# Patient Record
Sex: Male | Born: 1935 | Race: White | Hispanic: No | Smoking: Former smoker
Health system: Southern US, Community
[De-identification: ages and names within clinical notes are randomized; demographics above are authoritative.]

## PROBLEM LIST (undated history)

## (undated) DIAGNOSIS — IMO0001 Reserved for inherently not codable concepts without codable children: Secondary | ICD-10-CM

## (undated) DIAGNOSIS — E785 Hyperlipidemia, unspecified: Secondary | ICD-10-CM

## (undated) DIAGNOSIS — D649 Anemia, unspecified: Secondary | ICD-10-CM

## (undated) DIAGNOSIS — Z5189 Encounter for other specified aftercare: Secondary | ICD-10-CM

## (undated) DIAGNOSIS — F32A Depression, unspecified: Secondary | ICD-10-CM

## (undated) DIAGNOSIS — I10 Essential (primary) hypertension: Secondary | ICD-10-CM

## (undated) DIAGNOSIS — F329 Major depressive disorder, single episode, unspecified: Secondary | ICD-10-CM

## (undated) HISTORY — PX: TONSILLECTOMY AND ADENOIDECTOMY: SUR1326

---

## 1998-05-10 ENCOUNTER — Emergency Department (HOSPITAL_COMMUNITY): Admission: EM | Admit: 1998-05-10 | Discharge: 1998-05-10 | Payer: Self-pay | Admitting: Emergency Medicine

## 2001-11-08 ENCOUNTER — Encounter: Admission: RE | Admit: 2001-11-08 | Discharge: 2001-11-08 | Payer: Self-pay | Admitting: Psychiatry

## 2002-01-02 ENCOUNTER — Emergency Department (HOSPITAL_COMMUNITY): Admission: EM | Admit: 2002-01-02 | Discharge: 2002-01-02 | Payer: Self-pay

## 2004-06-30 ENCOUNTER — Ambulatory Visit: Payer: Self-pay | Admitting: Psychiatry

## 2004-06-30 ENCOUNTER — Ambulatory Visit (HOSPITAL_COMMUNITY): Payer: Self-pay | Admitting: Psychiatry

## 2004-07-22 ENCOUNTER — Ambulatory Visit (HOSPITAL_COMMUNITY): Admission: EM | Admit: 2004-07-22 | Discharge: 2004-07-22 | Payer: Self-pay | Admitting: Emergency Medicine

## 2005-09-29 ENCOUNTER — Emergency Department (HOSPITAL_COMMUNITY): Admission: EM | Admit: 2005-09-29 | Discharge: 2005-09-29 | Payer: Self-pay | Admitting: Emergency Medicine

## 2011-06-23 ENCOUNTER — Observation Stay (HOSPITAL_COMMUNITY)
Admission: EM | Admit: 2011-06-23 | Discharge: 2011-06-24 | Disposition: A | Discharge status: Home / Self Care | Payer: Medicare Other | Attending: Internal Medicine | Admitting: Internal Medicine

## 2011-06-23 ENCOUNTER — Encounter (HOSPITAL_COMMUNITY): Payer: Self-pay | Admitting: *Deleted

## 2011-06-23 DIAGNOSIS — F329 Major depressive disorder, single episode, unspecified: Secondary | ICD-10-CM | POA: Diagnosis present

## 2011-06-23 DIAGNOSIS — D72829 Elevated white blood cell count, unspecified: Secondary | ICD-10-CM | POA: Diagnosis present

## 2011-06-23 DIAGNOSIS — R55 Syncope and collapse: Principal | ICD-10-CM | POA: Insufficient documentation

## 2011-06-23 DIAGNOSIS — I10 Essential (primary) hypertension: Secondary | ICD-10-CM | POA: Insufficient documentation

## 2011-06-23 DIAGNOSIS — E785 Hyperlipidemia, unspecified: Secondary | ICD-10-CM | POA: Insufficient documentation

## 2011-06-23 DIAGNOSIS — N289 Disorder of kidney and ureter, unspecified: Secondary | ICD-10-CM | POA: Insufficient documentation

## 2011-06-23 DIAGNOSIS — IMO0002 Reserved for concepts with insufficient information to code with codable children: Secondary | ICD-10-CM

## 2011-06-23 DIAGNOSIS — W19XXXA Unspecified fall, initial encounter: Secondary | ICD-10-CM | POA: Insufficient documentation

## 2011-06-23 DIAGNOSIS — F3289 Other specified depressive episodes: Secondary | ICD-10-CM | POA: Insufficient documentation

## 2011-06-23 DIAGNOSIS — N179 Acute kidney failure, unspecified: Secondary | ICD-10-CM

## 2011-06-23 DIAGNOSIS — Y92009 Unspecified place in unspecified non-institutional (private) residence as the place of occurrence of the external cause: Secondary | ICD-10-CM | POA: Insufficient documentation

## 2011-06-23 DIAGNOSIS — S0180XA Unspecified open wound of other part of head, initial encounter: Secondary | ICD-10-CM | POA: Insufficient documentation

## 2011-06-23 HISTORY — DX: Anemia, unspecified: D64.9

## 2011-06-23 HISTORY — DX: Depression, unspecified: F32.A

## 2011-06-23 HISTORY — DX: Encounter for other specified aftercare: Z51.89

## 2011-06-23 HISTORY — DX: Essential (primary) hypertension: I10

## 2011-06-23 HISTORY — DX: Major depressive disorder, single episode, unspecified: F32.9

## 2011-06-23 HISTORY — DX: Hyperlipidemia, unspecified: E78.5

## 2011-06-23 HISTORY — DX: Reserved for inherently not codable concepts without codable children: IMO0001

## 2011-06-23 NOTE — ED Notes (Signed)
Per EMS - pt from home, pt lives alone - pt was sitting at computer tonight when experienced a syncopal episode, pt awoke went to the bathroom noted a laceration to left scalp and experienced a second syncopal episode - pt went to neighbor and called 911. Pt w/ approx 3in lac to left scalp and small lac to chin. Dressings applied by EMS. On arrival pt A&Ox4 in no acute distress.

## 2011-06-24 ENCOUNTER — Encounter (HOSPITAL_COMMUNITY): Payer: Self-pay | Admitting: Radiology

## 2011-06-24 ENCOUNTER — Emergency Department (HOSPITAL_COMMUNITY): Payer: Medicare Other

## 2011-06-24 ENCOUNTER — Observation Stay (HOSPITAL_COMMUNITY): Payer: Medicare Other

## 2011-06-24 DIAGNOSIS — D72829 Elevated white blood cell count, unspecified: Secondary | ICD-10-CM | POA: Diagnosis present

## 2011-06-24 DIAGNOSIS — E785 Hyperlipidemia, unspecified: Secondary | ICD-10-CM | POA: Diagnosis present

## 2011-06-24 DIAGNOSIS — I10 Essential (primary) hypertension: Secondary | ICD-10-CM | POA: Diagnosis present

## 2011-06-24 DIAGNOSIS — R55 Syncope and collapse: Secondary | ICD-10-CM | POA: Diagnosis present

## 2011-06-24 DIAGNOSIS — F329 Major depressive disorder, single episode, unspecified: Secondary | ICD-10-CM | POA: Diagnosis present

## 2011-06-24 DIAGNOSIS — N179 Acute kidney failure, unspecified: Secondary | ICD-10-CM | POA: Diagnosis present

## 2011-06-24 DIAGNOSIS — I517 Cardiomegaly: Secondary | ICD-10-CM

## 2011-06-24 LAB — CBC
Hemoglobin: 13.8 g/dL (ref 13.0–17.0)
MCH: 30.3 pg (ref 26.0–34.0)
MCHC: 33.4 g/dL (ref 30.0–36.0)
MCHC: 34.1 g/dL (ref 30.0–36.0)
MCV: 89.2 fL (ref 78.0–100.0)
Platelets: 275 10*3/uL (ref 150–400)
RBC: 4.63 MIL/uL (ref 4.22–5.81)

## 2011-06-24 LAB — DIFFERENTIAL
Eosinophils Absolute: 0 10*3/uL (ref 0.0–0.7)
Lymphs Abs: 1.3 10*3/uL (ref 0.7–4.0)
Monocytes Relative: 5 % (ref 3–12)
Neutrophils Relative %: 88 % — ABNORMAL HIGH (ref 43–77)

## 2011-06-24 LAB — POCT I-STAT, CHEM 8
Calcium, Ion: 1.2 mmol/L (ref 1.12–1.32)
Creatinine, Ser: 1.8 mg/dL — ABNORMAL HIGH (ref 0.50–1.35)
Glucose, Bld: 110 mg/dL — ABNORMAL HIGH (ref 70–99)
HCT: 43 % (ref 39.0–52.0)
Hemoglobin: 14.6 g/dL (ref 13.0–17.0)
Potassium: 3.8 mEq/L (ref 3.5–5.1)
Sodium: 142 mEq/L (ref 135–145)

## 2011-06-24 LAB — URINALYSIS, ROUTINE W REFLEX MICROSCOPIC
Bilirubin Urine: NEGATIVE
Glucose, UA: NEGATIVE mg/dL
Leukocytes, UA: NEGATIVE
Nitrite: NEGATIVE
Protein, ur: NEGATIVE mg/dL
Urobilinogen, UA: 1 mg/dL (ref 0.0–1.0)
pH: 5.5 (ref 5.0–8.0)

## 2011-06-24 LAB — CARDIAC PANEL(CRET KIN+CKTOT+MB+TROPI)
Relative Index: 1.5 (ref 0.0–2.5)
Total CK: 188 U/L (ref 7–232)

## 2011-06-24 LAB — D-DIMER, QUANTITATIVE: D-Dimer, Quant: 0.63 ug/mL-FEU — ABNORMAL HIGH (ref 0.00–0.48)

## 2011-06-24 MED ORDER — HYDROCODONE-ACETAMINOPHEN 5-325 MG PO TABS: 1.0000 | ORAL_TABLET | ORAL | Status: DC | PRN

## 2011-06-24 MED ORDER — POTASSIUM CHLORIDE IN NACL 20-0.9 MEQ/L-% IV SOLN
INTRAVENOUS | Status: DC
  Administered 2011-06-24: 07:00:00 via INTRAVENOUS
  Filled 2011-06-24 (×2): qty 1000

## 2011-06-24 MED ORDER — LISINOPRIL 5 MG PO TABS
5.0000 mg | ORAL_TABLET | Freq: Every day | ORAL | Status: DC
  Filled 2011-06-24: qty 1

## 2011-06-24 MED ORDER — TETANUS-DIPHTH-ACELL PERTUSSIS 5-2.5-18.5 LF-MCG/0.5 IM SUSP
0.5000 mL | Freq: Once | INTRAMUSCULAR | Status: AC
  Administered 2011-06-24: 0.5 mL via INTRAMUSCULAR
  Filled 2011-06-24: qty 0.5

## 2011-06-24 MED ORDER — SODIUM CHLORIDE 0.9 % IV SOLN
INTRAVENOUS | Status: DC
  Administered 2011-06-24: 11:00:00 via INTRAVENOUS

## 2011-06-24 MED ORDER — PNEUMOCOCCAL VAC POLYVALENT 25 MCG/0.5ML IJ INJ: 0.5000 mL | INJECTION | INTRAMUSCULAR | Status: DC

## 2011-06-24 MED ORDER — ACETAMINOPHEN 325 MG PO TABS: 650.0000 mg | ORAL_TABLET | Freq: Four times a day (QID) | ORAL | Status: DC | PRN

## 2011-06-24 MED ORDER — SIMVASTATIN 20 MG PO TABS
20.0000 mg | ORAL_TABLET | Freq: Every day | ORAL | Status: DC
  Filled 2011-06-24: qty 1

## 2011-06-24 MED ORDER — ACETAMINOPHEN 650 MG RE SUPP: 650.0000 mg | Freq: Four times a day (QID) | RECTAL | Status: DC | PRN

## 2011-06-24 MED ORDER — VENLAFAXINE HCL ER 150 MG PO CP24
150.0000 mg | ORAL_CAPSULE | Freq: Every day | ORAL | Status: DC
  Administered 2011-06-24: 150 mg via ORAL
  Filled 2011-06-24: qty 1

## 2011-06-24 MED ORDER — ASPIRIN EC 81 MG PO TBEC
81.0000 mg | DELAYED_RELEASE_TABLET | Freq: Every day | ORAL | Status: DC
  Administered 2011-06-24: 81 mg via ORAL
  Filled 2011-06-24: qty 1

## 2011-06-24 NOTE — ED Notes (Signed)
Report called to [facility], awaiting pt chest xray before sending up to unit. [place], [place] M

## 2011-06-24 NOTE — H&P (Signed)
PCP:   Paulino Rily, MD, MD   Chief Complaint:  Loss of consciousness  HPI: This is a 76 year old gentleman who lives alone. He states he wants to go to bathroom to have a BM. He went that he remembers being unsuccessful at his attempted #2. The next thing he remembers is waking up in with his head feeling damp. The patient had syncopized and collapsed and sustained left forehead laceration and two submandibular laceration. He does not remember any of the event. Currently he does not even know if he was found down in the bathroom or on the way back to the computer room. He went his neighbors house, where 911 was called. He states he was not ill prior to the episode, he does not remember having nausea, vomiting, diarrhea, no chest pains, no fevers, cough, BOU, or chills in the hours or days preceding the episode but he does not remember what proceeded or occurred during the episode. He remained to be patient is alert and oriented and provided his history.  Review of Systems: Positives bolded anorexia, fever, weight loss,, vision loss, decreased hearing, hoarseness, chest pain, syncope, dyspnea on exertion, peripheral edema, balance deficits, hemoptysis, abdominal pain, melena, hematochezia, severe indigestion/heartburn, hematuria, incontinence, genital sores, muscle weakness, suspicious skin lesions, transient blindness, difficulty walking, depression, unusual weight change, abnormal bleeding, enlarged lymph nodes, angioedema, and breast masses.  Past Medical History: Past Medical History  Diagnosis Date  . Hypertension   . Hyperlipidemia    Past Surgical History  Procedure Date  . Tonsillectomy and adenoidectomy     Medications: Prior to Admission medications   Medication Sig Start Date End Date Taking? Authorizing Provider  aspirin EC 81 MG tablet Take 81 mg by mouth daily.   Yes Historical Provider, MD  CALCIUM PO Take 1 tablet by mouth daily.   Yes Historical Provider, MD  LISINOPRIL  PO Take 1 tablet by mouth daily.   Yes Historical Provider, MD  Multiple Vitamins-Minerals (MULTIVITAMINS THER. W/MINERALS) TABS Take 1 tablet by mouth daily.   Yes Historical Provider, MD  PRESCRIPTION MEDICATION Take 1 tablet by mouth daily.   Yes Historical Provider, MD  venlafaxine XR (EFFEXOR-XR) 150 MG 24 hr capsule Take 150 mg by mouth daily.   Yes Historical Provider, MD    Allergies:  No Known Allergies  Social History:  reports that he has been smoking Pipe.  He does not have any smokeless tobacco history on file. He reports that he does not drink alcohol or use illicit drugs.  Family History: History reviewed. No pertinent family history.  Physical Exam: Filed Vitals:   06/23/11 2353 06/24/11 0250 06/24/11 0449  BP: 110/58 105/68 102/58  Pulse: 90    Temp: 98.1 F (36.7 C) 98.6 F (37 C)   TempSrc: Oral Oral   Resp: 14 23 16   SpO2: 96% 95% 94%    General:  Alert and oriented times three, well developed and nourished, no acute distress Eyes: PERRLA, pink conjunctiva, no scleral icterus ENT: Moist oral mucosa, neck supple, no thyromegaly Lungs: clear to ascultation, no wheeze, no crackles, no use of accessory muscles Cardiovascular: regular rate and rhythm, no regurgitation, no gallops, no murmurs. No carotid bruits, no JVD Abdomen: soft, positive BS, non-tender, non-distended, no organomegaly, not an acute abdomen GU: not examined Neuro: CN II - XII grossly intact, sensation intact Musculoskeletal: strength 5/5 all extremities, no clubbing, cyanosis or edema, sutured laceration left forehead, submandibular sutures  Skin: no rash, no subcutaneous crepitation, no decubitus  Psych: appropriate patient   Labs on Admission:   Basename 06/24/11 0225  NA 142  K 3.8  CL 107  CO2 --  GLUCOSE 110*  BUN 31*  CREATININE 1.80*  CALCIUM --  MG --  PHOS --   No results found for this basename: AST:2,ALT:2,ALKPHOS:2,BILITOT:2,PROT:2,ALBUMIN:2 in the last 72 hours No  results found for this basename: LIPASE:2,AMYLASE:2 in the last 72 hours  Basename 06/24/11 0225 06/24/11 0121  WBC -- 19.8*  NEUTROABS -- 17.4*  HGB 14.6 13.8  HCT 43.0 41.3  MCV -- 89.2  PLT -- 291   No results found for this basename: CKTOTAL:3,CKMB:3,CKMBINDEX:3,TROPONINI:3 in the last 72 hours No components found with this basename: POCBNP:3 No results found for this basename: DDIMER:2 in the last 72 hours No results found for this basename: HGBA1C:2 in the last 72 hours No results found for this basename: CHOL:2,HDL:2,LDLCALC:2,TRIG:2,CHOLHDL:2,LDLDIRECT:2 in the last 72 hours No results found for this basename: TSH,T4TOTAL,FREET3,T3FREE,THYROIDAB in the last 72 hours No results found for this basename: VITAMINB12:2,FOLATE:2,FERRITIN:2,TIBC:2,IRON:2,RETICCTPCT:2 in the last 72 hours  Micro Results: No results found for this or any previous visit (from the past 240 hour(s)). Results for Fred Thomas, Fred Thomas (MRN [id number]) as of 06/24/2011 05:41  Ref. Range 06/24/2011 04:30  Color, Urine Latest Range: YELLOW  YELLOW  APPearance Latest Range: CLEAR  CLEAR  Specific Gravity, Urine Latest Range: 1.005-1.030  1.018  pH Latest Range: 5.0-8.0  5.5  Glucose, UA Latest Range: NEGATIVE mg/dL NEGATIVE  Bilirubin Urine Latest Range: NEGATIVE  NEGATIVE  Ketones, ur Latest Range: NEGATIVE mg/dL NEGATIVE  Protein Latest Range: NEGATIVE mg/dL NEGATIVE  Urobilinogen, UA Latest Range: 0.0-1.0 mg/dL 1.0  Nitrite Latest Range: NEGATIVE  NEGATIVE  Leukocytes, UA Latest Range: NEGATIVE  NEGATIVE  Hgb urine dipstick Latest Range: NEGATIVE  NEGATIVE    Radiological Exams on Admission: Ct Head Wo Contrast  06/24/2011  *RADIOLOGY REPORT*  Clinical Data: Syncope, fall, head laceration.  CT HEAD WITHOUT CONTRAST,CT CERVICAL SPINE WITHOUT CONTRAST  Technique:  Contiguous axial images were obtained from the base of the skull through the vertex without contrast.,Technique: Multidetector CT imaging of the  cervical spine was performed. Multiplanar CT image reconstructions were also generated.  Comparison: None.  Findings: There is no evidence for acute hemorrhage, hydrocephalus, mass lesion, or abnormal extra-axial fluid collection.  No definite CT evidence for acute infarction.  The visualized paranasal sinuses and mastoid air cells are predominately clear. Left paramidline posterior scalp swelling.  No underlying calvarial fracture.  Cervical spine: Lung apices are clear.  Severe multilevel facet arthropathy.  Multilevel degenerative disc disease and anterior osteophyte formation.  Maintained craniocervical relationship.  No dens fracture.  There is minimal anterior wedging of the C5 vertebral body.  No displaced fracture.  Paravertebral soft tissues within normal limits.  IMPRESSION: No acute intracranial abnormality.  Posterior scalp swelling without underlying calvarial fracture.  Advanced multilevel degenerative changes of the cervical spine. There is minimal anterior wedging of C5 which is not favored to be acute however correlate with point tenderness.  No displaced fracture.  No dislocation.  Original Report Authenticated By: Waneta Martins, M.D.   Ct Cervical Spine Wo Contrast  06/24/2011  *RADIOLOGY REPORT*  Clinical Data: Syncope, fall, head laceration.  CT HEAD WITHOUT CONTRAST,CT CERVICAL SPINE WITHOUT CONTRAST  Technique:  Contiguous axial images were obtained from the base of the skull through the vertex without contrast.,Technique: Multidetector CT imaging of the cervical spine was performed. Multiplanar CT image reconstructions were also generated.  Comparison:  None.  Findings: There is no evidence for acute hemorrhage, hydrocephalus, mass lesion, or abnormal extra-axial fluid collection.  No definite CT evidence for acute infarction.  The visualized paranasal sinuses and mastoid air cells are predominately clear. Left paramidline posterior scalp swelling.  No underlying calvarial fracture.   Cervical spine: Lung apices are clear.  Severe multilevel facet arthropathy.  Multilevel degenerative disc disease and anterior osteophyte formation.  Maintained craniocervical relationship.  No dens fracture.  There is minimal anterior wedging of the C5 vertebral body.  No displaced fracture.  Paravertebral soft tissues within normal limits.  IMPRESSION: No acute intracranial abnormality.  Posterior scalp swelling without underlying calvarial fracture.  Advanced multilevel degenerative changes of the cervical spine. There is minimal anterior wedging of C5 which is not favored to be acute however correlate with point tenderness.  No displaced fracture.  No dislocation.  Original Report Authenticated By: Waneta Martins, M.D.     EKG: Normal sinus rhythm no ST segment changes  Assessment/Plan Present on Admission:  .Syncope and collapse Amnesia Admit to telemetry observation status With do a more complete workup including echo and carotid ultrasound. Will also cycle cardiac enzymes. Continue baby aspirin May be due to vasovagal episode that can accompany bowel movements  .Acute kidney injury .Leukocytosis Unexplained, chest x-ray is been ordered. Patient's UA is negative   will give IV fluids and repeat a BMP and CBC at 4 PM. Patient  asking if he can go home a workup is negative labs are improving, will defer to a.m. physician  No clear evidence of infection antibiotic not started Laceration to the forehead and under chin Sutured by ER physician  Full code SCDs for DVT prophylaxis T9/Dr. Delene Loll, Sosie Gato 06/24/2011, 5:41 AM

## 2011-06-24 NOTE — Progress Notes (Signed)
*  PRELIMINARY RESULTS* Echocardiogram 2D Echocardiogram has been performed.  Glean Salen [facility] 06/24/2011, 12:48 PM

## 2011-06-24 NOTE — Discharge Summary (Signed)
Physician Discharge Summary  Fred Thomas ZOX:[id number] DOB: May 14, 1935 DOA: 06/23/2011  PCP: Paulino Rily, MD, MD  Admit date: 06/23/2011 Discharge date: 06/24/2011  Recommendations for Outpatient Follow-up:  1. BP check - patient is now off medications 2. Resolution of neck pain - if persistent patient will need f/u with orthopedics or neurosurgery  3. Repeat CBC and BMET to assure normalization of values  4. Left ICA stenosis - not in symptomatic range - needs 1 year follow up   Discharge Diagnoses:  Active Problems:  Syncope and collapse  Acute kidney injury  Leukocytosis  Hypertension  Hyperlipidemia  Depression   Discharge Condition: good  Diet recommendation: regular  History of present illness:  This is a 76 year old gentleman who lives alone. He states he wants to go to bathroom to have a BM. He went that he remembers being unsuccessful at his attempted #2. The next thing he remembers is waking up in with his head feeling damp. The patient had syncopized and collapsed and sustained left forehead laceration and two submandibular laceration. He does not remember any of the event. Currently he does not even know if he was found down in the bathroom or on the way back to the computer room. He went his neighbors house, where 911 was called. He states he was not ill prior to the episode, he does not remember having nausea, vomiting, diarrhea, no chest pains, no fevers, cough, BOU, or chills in the hours or days preceding the episode but he does not remember what proceeded or occurred during the episode. He remained to be patient is alert and oriented and provided his history.   Hospital Course:  76 yo man admitted after syncopal event. He was kept on telemetry monitoring for 18 hours without any arrythmia noted. His echocardiogram, carotid dopplers and EEG were WNL. We observed that the patient had slightly elevated BUN and creatinine and WBCs suggesting mild dehydration  probably from diuretics. After iv fluids patient was completely asymptomatic and requested to be discharged home. We recommended discontinuation of the diuretic and acei and close F/U for lab work in the office.  The only other issue was patient's mild neck pain after fall. CT scan indicated possible minimal anterior wedging of C5 which is not favored to be acute. Patient was placed in a neck collar for comfort and he will f/u in the clinic.   Procedures: Echocardiogram Left ventricle: The cavity size was normal. Wall thickness was increased in a pattern of mild LVH. Systolic function was vigorous. The estimated ejection fraction was in the range of 65% to 70%.    Carotid doppler  No significant extracranial carotid artery stenosis demonstrated. Vertebrals are patent with antegrade flow. - Left proximal internal carotid is the in low 40-59% range velocities, but plaque formation does not support stenosis. This increase may be due proximal tortuosity. Other specific details can be found in the table(s) above   Discharge Exam: Filed Vitals:   06/24/11 1340  BP: 110/60  Pulse: 96  Temp: 98.1 F (36.7 C)  Resp: 20   Filed Vitals:   06/24/11 1101 06/24/11 1102 06/24/11 1103 06/24/11 1340  BP: 99/69 113/75 117/76 110/60  Pulse: 103 109 106 96  Temp:    98.1 F (36.7 C)  TempSrc:      Resp: 20 20 20 20   Height:      Weight:      SpO2: 96% 95% 95% 95%   General: alert and oriented x3 Cardiovascular: RRR, no  murmurs rubs or gallops Respiratory: CTAB Neuro - intact   Discharge Instructions  Discharge Orders    Future Orders Please Complete By Expires   Diet - low sodium heart healthy      Increase activity slowly        Medication List  As of 06/24/2011  5:14 PM   STOP taking these medications         CALCIUM PO      lisinopril-hydrochlorothiazide 20-12.5 MG per tablet      PRESCRIPTION MEDICATION         TAKE these medications         aspirin EC 81 MG tablet    Take 81 mg by mouth daily.      multivitamins ther. w/minerals Tabs   Take 1 tablet by mouth daily.      pravastatin 20 MG tablet   Commonly known as: PRAVACHOL   Take 20 mg by mouth daily.      venlafaxine XR 150 MG 24 hr capsule   Commonly known as: EFFEXOR-XR   Take 150 mg by mouth daily.           Follow-up Information    Follow up with Paulino Rily, MD in 5 days.   Contact information:   [address] [facility] [place] [address] [phone]           The results of significant diagnostics from this hospitalization (including imaging, microbiology, ancillary and laboratory) are listed below for reference.    Significant Diagnostic Studies: Dg Chest 1 View  06/24/2011  *RADIOLOGY REPORT*  Clinical Data: Chest pain, shortness of breath, fall.  CHEST - 1 VIEW  Comparison: 07/22/2004  Findings: Hyperaeration with increased interstitial markings.  No focal consolidation.  Elevated right hemidiaphragm is similar to prior.  No pleural effusion or pneumothorax.  No acute osseous abnormality identified.  IMPRESSION: COPD changes and without radiographic evidence for acute cardiopulmonary process.  Original Report Authenticated By: Waneta Martins, M.D.   Ct Head Wo Contrast  06/24/2011  *RADIOLOGY REPORT*  Clinical Data: Syncope, fall, head laceration.  CT HEAD WITHOUT CONTRAST,CT CERVICAL SPINE WITHOUT CONTRAST  Technique:  Contiguous axial images were obtained from the base of the skull through the vertex without contrast.,Technique: Multidetector CT imaging of the cervical spine was performed. Multiplanar CT image reconstructions were also generated.  Comparison: None.  Findings: There is no evidence for acute hemorrhage, hydrocephalus, mass lesion, or abnormal extra-axial fluid collection.  No definite CT evidence for acute infarction.  The visualized paranasal sinuses and mastoid air cells are predominately clear. Left paramidline posterior scalp swelling.  No  underlying calvarial fracture.  Cervical spine: Lung apices are clear.  Severe multilevel facet arthropathy.  Multilevel degenerative disc disease and anterior osteophyte formation.  Maintained craniocervical relationship.  No dens fracture.  There is minimal anterior wedging of the C5 vertebral body.  No displaced fracture.  Paravertebral soft tissues within normal limits.  IMPRESSION: No acute intracranial abnormality.  Posterior scalp swelling without underlying calvarial fracture.  Advanced multilevel degenerative changes of the cervical spine. There is minimal anterior wedging of C5 which is not favored to be acute however correlate with point tenderness.  No displaced fracture.  No dislocation.  Original Report Authenticated By: Waneta Martins, M.D.   Ct Cervical Spine Wo Contrast  06/24/2011  *RADIOLOGY REPORT*  Clinical Data: Syncope, fall, head laceration.  CT HEAD WITHOUT CONTRAST,CT CERVICAL SPINE WITHOUT CONTRAST  Technique:  Contiguous axial images were obtained from the  base of the skull through the vertex without contrast.,Technique: Multidetector CT imaging of the cervical spine was performed. Multiplanar CT image reconstructions were also generated.  Comparison: None.  Findings: There is no evidence for acute hemorrhage, hydrocephalus, mass lesion, or abnormal extra-axial fluid collection.  No definite CT evidence for acute infarction.  The visualized paranasal sinuses and mastoid air cells are predominately clear. Left paramidline posterior scalp swelling.  No underlying calvarial fracture.  Cervical spine: Lung apices are clear.  Severe multilevel facet arthropathy.  Multilevel degenerative disc disease and anterior osteophyte formation.  Maintained craniocervical relationship.  No dens fracture.  There is minimal anterior wedging of the C5 vertebral body.  No displaced fracture.  Paravertebral soft tissues within normal limits.  IMPRESSION: No acute intracranial abnormality.  Posterior  scalp swelling without underlying calvarial fracture.  Advanced multilevel degenerative changes of the cervical spine. There is minimal anterior wedging of C5 which is not favored to be acute however correlate with point tenderness.  No displaced fracture.  No dislocation.  Original Report Authenticated By: Waneta Martins, M.D.    Microbiology: No results found for this or any previous visit (from the past 240 hour(s)).   Labs: Basic Metabolic Panel:  Lab 06/24/11 1610  NA 142  K 3.8  CL 107  CO2 --  GLUCOSE 110*  BUN 31*  CREATININE 1.80*  CALCIUM --  MG --  PHOS --   Liver Function Tests: No results found for this basename: AST:5,ALT:5,ALKPHOS:5,BILITOT:5,PROT:5,ALBUMIN:5 in the last 168 hours No results found for this basename: LIPASE:5,AMYLASE:5 in the last 168 hours No results found for this basename: AMMONIA:5 in the last 168 hours CBC:  Lab 06/24/11 0917 06/24/11 0225 06/24/11 0121  WBC 13.8* -- 19.8*  NEUTROABS -- -- 17.4*  HGB 12.8* 14.6 13.8  HCT 37.5* 43.0 41.3  MCV 88.7 -- 89.2  PLT 275 -- 291   Cardiac Enzymes:  Lab 06/24/11 1550 06/24/11 0917 06/24/11 0640  CKTOTAL 186 188 --  CKMB 2.8 3.7 --  CKMBINDEX -- -- --  TROPONINI <0.30 <0.30 <0.30   BNP: BNP (last 3 results) No results found for this basename: PROBNP:3 in the last 8760 hours CBG: No results found for this basename: GLUCAP:5 in the last 168 hours  Time coordinating discharge: 1 hour  Signed:  Lonia Blood, MD  [facility] 06/24/2011, 5:14 PM

## 2011-06-24 NOTE — Progress Notes (Signed)
*  PRELIMINARY RESULTS* Vascular Ultrasound Carotid Duplex (Doppler) has been completed.  Preliminary findings: Bilaterally no significant ICA stenosis with antegrade vertebral flow.  Farrel Demark RDMS 06/24/2011, 12:56 PM

## 2011-06-24 NOTE — Progress Notes (Signed)
DC IV, DC Tele, DC Home. Discharge instructions and home medications discussed with patient. Patient denies any questions or concerns at that time. Patient leaving unit ambulatory and appears to be in no acute distress.

## 2011-06-24 NOTE — ED Provider Notes (Signed)
History     CSN: [id number]  Arrival date & time 06/23/11  2342   First MD Initiated Contact with Patient 06/24/11 0201      Chief Complaint  Patient presents with  . Loss of Consciousness  . Head Laceration    (Consider location/radiation/quality/duration/timing/severity/associated sxs/prior treatment) Patient is a 76 y.o. male presenting with syncope and scalp laceration. The history is provided by the patient.  Loss of Consciousness Pertinent negatives include no chest pain, no abdominal pain and no headaches.  Head Laceration Pertinent negatives include no chest pain, no abdominal pain and no headaches.   the patient is a 76 year old, male, who lives alone.  Is no history of coronary artery disease.  He was brought to the emergency department after having a syncopal episode and cutting his forehead, and chin on the left side.  He has no recollection of what occurred.  He has a mild headache now.  He denies nausea, or vision changes.  He denies neck pain.  He denies weakness, or paresthesias.  He does not smoke or drink alcohol or take drugs.  He denies recent illness.  He had no symptoms prior to fainting.  Past Medical History  Diagnosis Date  . Hypertension   . Hyperlipidemia     History reviewed. No pertinent past surgical history.  History reviewed. No pertinent family history.  History  Substance Use Topics  . Smoking status: Not on file  . Smokeless tobacco: Not on file  . Alcohol Use:       Review of Systems  Constitutional: Negative for fever and chills.  HENT: Negative for neck pain and neck stiffness.   Eyes: Negative for photophobia and visual disturbance.  Respiratory: Negative for chest tightness.   Cardiovascular: Positive for syncope. Negative for chest pain and palpitations.  Gastrointestinal: Negative for nausea, vomiting and abdominal pain.  Musculoskeletal: Negative for back pain.  Skin: Positive for wound.  Neurological: Positive for syncope.  Negative for headaches.  Hematological: Does not bruise/bleed easily.  Psychiatric/Behavioral: Negative for confusion.  All other systems reviewed and are negative.    Allergies  Review of patient's allergies indicates no known allergies.  Home Medications   Current Outpatient Rx  Name Route Sig Dispense Refill  . ASPIRIN EC 81 MG PO TBEC Oral Take 81 mg by mouth daily.    Marland Kitchen CALCIUM PO Oral Take 1 tablet by mouth daily.    Marland Kitchen LISINOPRIL PO Oral Take 1 tablet by mouth daily.    Carma Leaven M PLUS PO TABS Oral Take 1 tablet by mouth daily.    Marland Kitchen PRESCRIPTION MEDICATION Oral Take 1 tablet by mouth daily.    . VENLAFAXINE HCL ER 150 MG PO CP24 Oral Take 150 mg by mouth daily.      BP 110/58  Pulse 90  Temp(Src) 98.1 F (36.7 C) (Oral)  Resp 14  SpO2 96%  Physical Exam  Nursing note and vitals reviewed. Constitutional: He appears well-developed and well-nourished. No distress.  HENT:  Head: Normocephalic.       Left forehead, 6 cm clean laceration, with no active bleeding Left chin 2 cm clean laceration, with mild bleeding  Eyes: Conjunctivae are normal. Pupils are equal, round, and reactive to light.  Neck: Normal range of motion. Neck supple.       No cervical tenderness.  Collar removed after CAT scan results available  Cardiovascular: Normal rate.   No murmur heard. Pulmonary/Chest: Effort normal and breath sounds normal. No respiratory distress. He  has no rales.  Abdominal: Soft. He exhibits no distension.  Musculoskeletal: Normal range of motion. He exhibits no edema and no tenderness.  Neurological: He is alert. No cranial nerve deficit.  Skin: Skin is warm and dry.  Psychiatric: He has a normal mood and affect. Thought content normal.    ED Course  Procedures (including critical care time) Syncopal episode with left forehead, and left chin lacerations. The patient had no warning symptoms.  Prior to fainting.  He has no recollection of his fall.  There is no evidence  of neurological deficit.  His CAT scans do not show significant injury.  We'll perform laboratory testing, EKG, and arrange admission.  After suturing his wounds.  Labs Reviewed  CBC - Abnormal; Notable for the following:    WBC 19.8 (*)    All other components within normal limits  DIFFERENTIAL - Abnormal; Notable for the following:    Neutrophils Relative 88 (*)    Neutro Abs 17.4 (*)    Lymphocytes Relative 7 (*)    All other components within normal limits  POCT I-STAT, CHEM 8 - Abnormal; Notable for the following:    BUN 31 (*)    Creatinine, Ser 1.80 (*)    Glucose, Bld 110 (*)    All other components within normal limits  URINALYSIS, ROUTINE W REFLEX MICROSCOPIC   Ct Head Wo Contrast  06/24/2011  *RADIOLOGY REPORT*  Clinical Data: Syncope, fall, head laceration.  CT HEAD WITHOUT CONTRAST,CT CERVICAL SPINE WITHOUT CONTRAST  Technique:  Contiguous axial images were obtained from the base of the skull through the vertex without contrast.,Technique: Multidetector CT imaging of the cervical spine was performed. Multiplanar CT image reconstructions were also generated.  Comparison: None.  Findings: There is no evidence for acute hemorrhage, hydrocephalus, mass lesion, or abnormal extra-axial fluid collection.  No definite CT evidence for acute infarction.  The visualized paranasal sinuses and mastoid air cells are predominately clear. Left paramidline posterior scalp swelling.  No underlying calvarial fracture.  Cervical spine: Lung apices are clear.  Severe multilevel facet arthropathy.  Multilevel degenerative disc disease and anterior osteophyte formation.  Maintained craniocervical relationship.  No dens fracture.  There is minimal anterior wedging of the C5 vertebral body.  No displaced fracture.  Paravertebral soft tissues within normal limits.  IMPRESSION: No acute intracranial abnormality.  Posterior scalp swelling without underlying calvarial fracture.  Advanced multilevel degenerative  changes of the cervical spine. There is minimal anterior wedging of C5 which is not favored to be acute however correlate with point tenderness.  No displaced fracture.  No dislocation.  Original Report Authenticated By: Waneta Martins, M.D.   Ct Cervical Spine Wo Contrast  06/24/2011  *RADIOLOGY REPORT*  Clinical Data: Syncope, fall, head laceration.  CT HEAD WITHOUT CONTRAST,CT CERVICAL SPINE WITHOUT CONTRAST  Technique:  Contiguous axial images were obtained from the base of the skull through the vertex without contrast.,Technique: Multidetector CT imaging of the cervical spine was performed. Multiplanar CT image reconstructions were also generated.  Comparison: None.  Findings: There is no evidence for acute hemorrhage, hydrocephalus, mass lesion, or abnormal extra-axial fluid collection.  No definite CT evidence for acute infarction.  The visualized paranasal sinuses and mastoid air cells are predominately clear. Left paramidline posterior scalp swelling.  No underlying calvarial fracture.  Cervical spine: Lung apices are clear.  Severe multilevel facet arthropathy.  Multilevel degenerative disc disease and anterior osteophyte formation.  Maintained craniocervical relationship.  No dens fracture.  There is minimal anterior  wedging of the C5 vertebral body.  No displaced fracture.  Paravertebral soft tissues within normal limits.  IMPRESSION: No acute intracranial abnormality.  Posterior scalp swelling without underlying calvarial fracture.  Advanced multilevel degenerative changes of the cervical spine. There is minimal anterior wedging of C5 which is not favored to be acute however correlate with point tenderness.  No displaced fracture.  No dislocation.  Original Report Authenticated By: Waneta Martins, M.D.     No diagnosis found.  ECG Normal sinus rhythm with rate 92 Normal axis Normal.  Interval Nonspecific T-wave changes, but no significant change compared to 07/22/2004  LACERATION  REPAIR Performed by: Eirene Rather P Authorized by: Nicholes Stairs Consent: Verbal consent obtained. Risks and benefits: risks, benefits and alternatives were discussed Consent given by: patient Patient identity confirmed: provided demographic data Prepped and Draped in normal sterile fashion Wound explored  Laceration Location: left forehead Laceration Length:  4 cm  No Foreign Bodies seen or palpated  Anesthesia: local infiltration  Local anesthetic: lidocaine 2% with epinephrine  Anesthetic total: 3 ml  Irrigation method: syringe Amount of cleaning: standard  Skin closure: simple interrupted, 4-0 proline  Number of sutures: 8  Technique: simple interrupted  Patient tolerance: Patient tolerated the procedure well with no immediate complications.  LACERATION REPAIR Performed by: Nicholes Stairs Authorized by: Nicholes Stairs Consent: Verbal consent obtained. Risks and benefits: risks, benefits and alternatives were discussed Consent given by: patient Patient identity confirmed: provided demographic data Prepped and Draped in normal sterile fashion Wound explored  Laceration Location: left chin  Laceration Length: 2cm  No Foreign Bodies seen or palpated  Anesthesia: local infiltration  Local anesthetic: lidocaine 2 % with epinephrine  Anesthetic total: 2 ml  Irrigation method: syringe Amount of cleaning: standard  Skin closure: 3-0 proline  Number of sutures: 3  Technique: simple interrupted  Patient tolerance: Patient tolerated the procedure well with no immediate complications.  MDM  Syncope Left forehead laceration, and left chin laceration No results.  No spinal cord injuries Normal.  Neurological examination and mental status        Cheri Guppy, MD 06/24/11 [phone]

## 2011-06-24 NOTE — Progress Notes (Signed)
Pt is a daily pipe smoker who is in action stage and plans to quit on his own. Encouraged pt to quit and discussed and answered an extensive number of questions from the pt. Referred to 1-800 quit now for f/u and support. Discussed oral fixation substitutes, second hand smoke and in home smoking policy. Reviewed and gave pt Written education/contact information.

## 2011-06-27 ENCOUNTER — Ambulatory Visit (HOSPITAL_COMMUNITY): Payer: Medicare Other

## 2015-11-28 ENCOUNTER — Emergency Department (HOSPITAL_BASED_OUTPATIENT_CLINIC_OR_DEPARTMENT_OTHER)
Admission: EM | Admit: 2015-11-28 | Discharge: 2015-11-28 | Disposition: A | Discharge status: Home / Self Care | Payer: Medicare Other | Attending: Emergency Medicine | Admitting: Emergency Medicine

## 2015-11-28 ENCOUNTER — Encounter (HOSPITAL_BASED_OUTPATIENT_CLINIC_OR_DEPARTMENT_OTHER): Payer: Self-pay

## 2015-11-28 DIAGNOSIS — Z79899 Other long term (current) drug therapy: Secondary | ICD-10-CM | POA: Insufficient documentation

## 2015-11-28 DIAGNOSIS — Z7982 Long term (current) use of aspirin: Secondary | ICD-10-CM | POA: Diagnosis not present

## 2015-11-28 DIAGNOSIS — I10 Essential (primary) hypertension: Secondary | ICD-10-CM | POA: Diagnosis not present

## 2015-11-28 DIAGNOSIS — F17290 Nicotine dependence, other tobacco product, uncomplicated: Secondary | ICD-10-CM | POA: Insufficient documentation

## 2015-11-28 DIAGNOSIS — K0889 Other specified disorders of teeth and supporting structures: Secondary | ICD-10-CM | POA: Diagnosis present

## 2015-11-28 MED ORDER — PENICILLIN V POTASSIUM 500 MG PO TABS: 500.0000 mg | ORAL_TABLET | Freq: Four times a day (QID) | ORAL | 0 refills | Status: AC

## 2015-11-28 MED ORDER — BUPIVACAINE-EPINEPHRINE (PF) 0.5% -1:200000 IJ SOLN
1.8000 mL | Freq: Once | INTRAMUSCULAR | Status: AC
  Administered 2015-11-28: 1.8 mL

## 2015-11-28 MED ORDER — HYDROCODONE-ACETAMINOPHEN 5-325 MG PO TABS: 0.5000 | ORAL_TABLET | ORAL | 0 refills | Status: AC | PRN

## 2015-11-28 MED ORDER — BUPIVACAINE-EPINEPHRINE (PF) 0.5% -1:200000 IJ SOLN
INTRAMUSCULAR | Status: AC
  Filled 2015-11-28: qty 1.8

## 2015-11-28 MED ORDER — PENICILLIN V POTASSIUM 250 MG PO TABS
500.0000 mg | ORAL_TABLET | Freq: Once | ORAL | Status: AC
  Administered 2015-11-28: 500 mg via ORAL
  Filled 2015-11-28: qty 2

## 2015-11-28 NOTE — ED Triage Notes (Signed)
Pt c/o pain to rt upper back tooth x1wk, states the crown came off a year ago but the now having pain and swelling

## 2015-11-28 NOTE — ED Provider Notes (Signed)
[facility] Provider Note: [name] [name], MD, FACEP  CSN: [id number] MRN: [id number] ARRIVAL: 11/28/15 at 0157 ROOM: [facility]   CHIEF COMPLAINT  Dental Pain   HISTORY OF PRESENT ILLNESS  Fred Thomas is a 80 y.o. male with a right upper molar that fractured about a year ago. He has had pain in that tooth for about the last week which worsened over the past 24 hours. There is associated tenderness and swelling of the adjacent gum. Pain is moderate and worse with palpation or eating. He does not have a dentist currently.   Past Medical History:  Diagnosis Date  . Anemia   . Blood transfusion   . Depression   . Hyperlipidemia   . Hypertension     Past Surgical History:  Procedure Laterality Date  . TONSILLECTOMY AND ADENOIDECTOMY      No family history on file.  Social History  Substance Use Topics  . Smoking status: Current Every Day Smoker    Years: 10.00    Types: Pipe  . Smokeless tobacco: Never Used  . Alcohol use No    Prior to Admission medications   Medication Sig Start Date End Date Taking? Authorizing Provider  aspirin EC 81 MG tablet Take 81 mg by mouth daily.    Historical Provider, MD  HYDROcodone-acetaminophen (NORCO) 5-325 MG tablet Take 0.5-1 tablets by mouth every 4 (four) hours as needed (for pain; may cause constipation). 11/28/15   Paula LibraJohn Neima Lacross, MD  Multiple Vitamins-Minerals (MULTIVITAMINS THER. W/MINERALS) TABS Take 1 tablet by mouth daily.    Historical Provider, MD  penicillin v potassium (VEETID) 500 MG tablet Take 1 tablet (500 mg total) by mouth 4 (four) times daily. 11/28/15   Bharath Bernstein, MD  pravastatin (PRAVACHOL) 20 MG tablet Take 20 mg by mouth daily.    Historical Provider, MD  venlafaxine XR (EFFEXOR-XR) 150 MG 24 hr capsule Take 150 mg by mouth daily.    Historical Provider, MD    Allergies Review of patient's allergies indicates no known allergies.   REVIEW OF SYSTEMS  Negative except as noted here or in the  History of Present Illness.   PHYSICAL EXAMINATION  Initial Vital Signs Blood pressure 156/91, pulse 91, temperature 98.8 F (37.1 C), temperature source Oral, resp. rate 18, SpO2 100 %.  Examination General: Well-developed, well-nourished male in no acute distress; appears younger than age of record HENT: normocephalic; atraumatic; right upper molar with fracture and tenderness to percussion with adjacent soft tissue swelling Eyes: Normal appearance Neck: supple; no lymphadenopathy Heart: regular rate and rhythm Lungs: Normal respiratory effort and excursion Abdomen: soft; nondistended Extremities: No deformity; full range of motion Neurologic: Awake, alert and oriented; motor function intact in all extremities and symmetric; no facial droop Skin: Warm and dry Psychiatric: Normal mood and affect   RESULTS  Summary of this visit's results, reviewed by myself:   EKG Interpretation  Date/Time:    Ventricular Rate:    PR Interval:    QRS Duration:   QT Interval:    QTC Calculation:   R Axis:     Text Interpretation:        Laboratory Studies: No results found for this or any previous visit (from the past 24 hour(s)). Imaging Studies: No results found.  ED COURSE  Nursing notes and initial vitals signs, including pulse oximetry, reviewed.  Vitals:   11/28/15 0212  BP: 156/91  Pulse: 91  Resp: 18  Temp: 98.8 F (37.1 C)  TempSrc: Oral  SpO2: 100%    PROCEDURES   DENTAL BLOCK 1.8 milliliters of 0.5% bupivacaine with epinephrine were injected into the buccal fold adjacent to the affected tooth. The patient tolerated this well and there were no immediate complications. Adequate analgesia was obtained.   ED DIAGNOSES     ICD-9-CM ICD-10-CM   1. Pain, dental 525.9 K08.89        Paula LibraJohn Mayana Irigoyen, MD 11/28/15 [phone]

## 2015-11-28 NOTE — ED Notes (Signed)
MD at bedside. 

## 2019-06-02 ENCOUNTER — Ambulatory Visit (HOSPITAL_COMMUNITY)
Admission: AD | Admit: 2019-06-02 | Discharge: 2019-06-02 | Disposition: A | Discharge status: Home / Self Care | Payer: Medicare Other | Attending: Psychiatry | Admitting: Psychiatry

## 2019-06-02 DIAGNOSIS — H9190 Unspecified hearing loss, unspecified ear: Secondary | ICD-10-CM | POA: Insufficient documentation

## 2019-06-02 DIAGNOSIS — R45851 Suicidal ideations: Secondary | ICD-10-CM | POA: Diagnosis not present

## 2019-06-02 DIAGNOSIS — F418 Other specified anxiety disorders: Secondary | ICD-10-CM | POA: Diagnosis not present

## 2019-06-02 DIAGNOSIS — F319 Bipolar disorder, unspecified: Secondary | ICD-10-CM | POA: Insufficient documentation

## 2019-06-02 NOTE — H&P (Signed)
Behavioral Health Medical Screening Exam  Fred Thomas is an 84 y.o. male.  Presents as a walk-in to [facility] accompanied by his son.  Patient has concerns with " mental health" citing spending 500,000 and is currently residing in a motel.  Reported previous history of depression anxiety and bipolar disorder.  States he has never been followed by psychiatry and/or therapy in the past.  Son would like additional resources for social work services and possibly APS to do investigation due to current living conditions.  Reported passive suicidal ideations however denies intent and plan.  Son denied patient has access to guns or weapons.  Patient is adamant about " not wanting to hurt himself or others."  Denies auditory or visual hallucinations.  Denies that he uses drugs or alcohol.  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  Patient appears to be focused with loss of hearing poor eyesight.  Patient is seeking a primary care doctor.  TTS to provide additional outpatient resources.  Support, encouragement and reassurance was provided.  Total Time spent with patient: 15 minutes  Psychiatric Specialty Exam: Physical Exam  Constitutional: He is oriented to person, place, and time.  Neurological: He is alert and oriented to person, place, and time.  Psychiatric: He has a normal mood and affect. His behavior is normal.    Review of Systems  Psychiatric/Behavioral: The patient is nervous/anxious.   All other systems reviewed and are negative.   Blood pressure (!) 151/88, pulse (!) 110, temperature 98.6 F (37 C), temperature source Oral, resp. rate 18, SpO2 98 %.There is no height or weight on file to calculate BMI.  General Appearance: Casual  Eye Contact:  Good  Speech:  Clear and Coherent  Volume:  Increased  Mood:  Anxious and Depressed  Affect:  Congruent  Thought Process:  Coherent  Orientation:  Full (Time, Place, and Person)  Thought Content:  Logical  Suicidal Thoughts:   Yes.  without intent/plan  Homicidal Thoughts:  No  Memory:  Immediate;   Fair Recent;   Fair  Judgement:  Fair  Insight:  Fair  Psychomotor Activity:  Normal  Concentration: Concentration: Fair  Recall:  [employer] of Knowledge:Fair  Language: Fair  Akathisia:  No  Handed:  Right  AIMS (if indicated):     Assets:  Communication Skills Desire for Improvement Resilience Social Support  Sleep:       Musculoskeletal: Strength & Muscle Tone: within normal limits Gait & Station: normal Patient leans: N/A  Blood pressure (!) 151/88, pulse (!) 110, temperature 98.6 F (37 C), temperature source Oral, resp. rate 18, SpO2 98 %.  Recommendations: Patient and family was provided with outpatient resources.  Discussed [facility]   Based on my evaluation the patient does not appear to have an emergency medical condition.  Oneta Rack, NP 06/02/2019, 2:19 PM

## 2019-06-02 NOTE — BH Assessment (Signed)
Assessment Note  Fred Thomas is an 84 y.o. male present to [facility] as a walk-in accompanied by his son Nathan Moctezuma III [phone]). Patient has hearing and vision problems and relied on his son for clarity throughout the assessment. Patient reports feeling suicidal but denies a plan. When asked why are you here patient stated, "I have bad hearing, lost my eye sight and lost $500,000." Denies that he uses drugs or alcohol.  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 His son reports for the past 7-years his father has lived in a hotel. He's concerned about of his dad's overall health including mental health. Report his dad has mental health diagnosis of Bi-polar, Depression and anxiety. Report his dad does not eat or sleep properly and has compulsive behaviors such as hoarding and dumpster driving. The son inquired about getting DSS adult protective services involved in his dad's life as a supportive agency without losing his power of authority privileges.    Ricky Ala, NP, patient is psych-cleared with community resources   Diagnosis: MDD  Past Medical History:  Past Medical History:  Diagnosis Date  . Anemia   . Blood transfusion   . Depression   . Hyperlipidemia   . Hypertension     Past Surgical History:  Procedure Laterality Date  . TONSILLECTOMY AND ADENOIDECTOMY      Family History: No family history on file.  Social History:  reports that he has been smoking pipe. He has smoked for the past 10.00 years. He has never used smokeless tobacco. He reports that he does not drink alcohol or use drugs.  Additional Social History:  Alcohol / Drug Use Pain Medications: see MAR Prescriptions: see MAR Over the Counter: see MAR History of alcohol / drug use?: No history of alcohol / drug abuse  CIWA: CIWA-Ar BP: (!) 151/88 Pulse Rate: (!) 110 COWS:    Allergies: No Known Allergies  Home Medications: (Not in a hospital admission)   OB/GYN Status:  No LMP for male  patient.  General Assessment Data Location of Assessment: [facility](walk-in) TTS Assessment: In system Is this a Tele or Face-to-Face Assessment?: Face-to-Face Is this an Initial Assessment or a Re-assessment for this encounter?: Initial Assessment Patient Accompanied by:: Adult(accompanied by his adult son) Permission Given to speak with another: Yes Name, Relationship and Phone Number: son; Taelyn Nemes II [phone]) Language Other than English: No Living Arrangements: Other (Comment)(lives in a motel ) What gender do you identify as?: Male Living Arrangements: Other (Comment)(live in a hotel ) Can pt return to current living arrangement?: Yes Admission Status: Voluntary Is patient capable of signing voluntary admission?: Yes Referral Source: Self/Family/Friend Insurance type: Medicare   Medical Screening Exam ([place]) Medical Exam completed: Yes  Crisis Care Plan Living Arrangements: Other (Comment)(live in a hotel ) Name of Psychiatrist: none report  Name of Therapist: none report   Education Status Is patient currently in school?: No  Risk to self with the past 6 months Suicidal Ideation: Yes-Currently Present(Report suicidal ideation with no plan ) Has patient been a risk to self within the past 6 months prior to admission? : No Suicidal Intent: No Has patient had any suicidal intent within the past 6 months prior to admission? : No Is patient at risk for suicide?: No Suicidal Plan?: No Has patient had any suicidal plan within the past 6 months prior to admission? : No Access to Means: No What has been your use of drugs/alcohol within the last 12  months?: none report  Previous Attempts/Gestures: No How many times?: (per son no previous attempts ) Other Self Harm Risks: none report  Triggers for Past Attempts: None known Intentional Self Injurious Behavior: None Family Suicide History: No Recent stressful life event(s): Other  (Comment)(medical issues) Persecutory voices/beliefs?: No Depression: Yes Depression Symptoms: Guilt(anxiety ) Substance abuse history and/or treatment for substance abuse?: No Suicide prevention information given to non-admitted patients: Not applicable  Risk to Others within the past 6 months Homicidal Ideation: No Does patient have any lifetime risk of violence toward others beyond the six months prior to admission? : No Thoughts of Harm to Others: No-Not Currently Present/Within Last 6 Months Current Homicidal Intent: No Current Homicidal Plan: No Access to Homicidal Means: No Identified Victim: none report  History of harm to others?: No Assessment of Violence: None Noted Violent Behavior Description: none report  Does patient have access to weapons?: No Criminal Charges Pending?: No Does patient have a court date: No Is patient on probation?: No  Psychosis Hallucinations: None noted Delusions: None noted  Mental Status Report Appearance/Hygiene: Unremarkable Eye Contact: Poor(client report bad eye sight ) Motor Activity: Freedom of movement Speech: Loud(client spoke loud due to having hearing problems) Level of Consciousness: Other (Comment)(Hard for client to follow due to being hard of hearing. ) Mood: Pleasant Affect: Appropriate to circumstance Anxiety Level: (client report suffering with anxiety ) Thought Processes: Circumstantial Judgement: Unimpaired(Report suicidal thoughts no plan ) Orientation: Person, Place, Time, Situation Obsessive Compulsive Thoughts/Behaviors: None  Cognitive Functioning Concentration: Decreased Memory: Recent Impaired, Remote Impaired Is patient IDD: No Insight: Poor Impulse Control: Fair Appetite: Poor Have you had any weight changes? : Loss(per son yes, unknown amount) Amount of the weight change? (lbs): (per son unknown ) Sleep: Unable to Assess Vegetative Symptoms: None  ADLScreening [facility]) Patient's  cognitive ability adequate to safely complete daily activities?: Yes Patient able to express need for assistance with ADLs?: Yes Independently performs ADLs?: Yes (appropriate for developmental age)  Prior Inpatient Therapy Prior Inpatient Therapy: No  Prior Outpatient Therapy Prior Outpatient Therapy: No Does patient have an ACCT team?: No Does patient have Intensive In-House Services?  : No Does patient have [facility] services? : No Does patient have P4CC services?: No  ADL Screening (condition at time of admission) Patient's cognitive ability adequate to safely complete daily activities?: Yes Is the patient deaf or have difficulty hearing?: No Does the patient have difficulty seeing, even when wearing glasses/contacts?: No Does the patient have difficulty concentrating, remembering, or making decisions?: No Patient able to express need for assistance with ADLs?: Yes Does the patient have difficulty dressing or bathing?: No Independently performs ADLs?: Yes (appropriate for developmental age) Does the patient have difficulty walking or climbing stairs?: No       Abuse/Neglect Assessment (Assessment to be complete while patient is alone) Abuse/Neglect Assessment Can Be Completed: Unable to assess, patient is non-responsive or altered mental status     Advance Directives (For Healthcare) Does Patient Have a Medical Advance Directive?: No Would patient like information on creating a medical advance directive?: No - Patient declined          Disposition:  Disposition Initial Assessment Completed for this Encounter: Nicanor Alcon, NP, pt psych-cleared with outpatient resources) Disposition of Patient: Discharge(Tanika Melvyn Neth, NP, pt psych-cleared with outpatient resource )  On Site Evaluation by:   Reviewed with Physician:    Dian Situ 06/02/2019 3:06 PM

## 2020-02-23 ENCOUNTER — Emergency Department (HOSPITAL_BASED_OUTPATIENT_CLINIC_OR_DEPARTMENT_OTHER): Payer: Medicare Other

## 2020-02-23 ENCOUNTER — Other Ambulatory Visit: Payer: Self-pay

## 2020-02-23 ENCOUNTER — Encounter (HOSPITAL_BASED_OUTPATIENT_CLINIC_OR_DEPARTMENT_OTHER): Payer: Self-pay | Admitting: *Deleted

## 2020-02-23 ENCOUNTER — Emergency Department (HOSPITAL_BASED_OUTPATIENT_CLINIC_OR_DEPARTMENT_OTHER)
Admission: EM | Admit: 2020-02-23 | Discharge: 2020-03-06 | Disposition: A | Discharge status: Other Acute Inpatient Hospital | Payer: Medicare Other | Attending: Emergency Medicine | Admitting: Emergency Medicine

## 2020-02-23 DIAGNOSIS — Z789 Other specified health status: Secondary | ICD-10-CM | POA: Diagnosis present

## 2020-02-23 DIAGNOSIS — F603 Borderline personality disorder: Secondary | ICD-10-CM | POA: Diagnosis not present

## 2020-02-23 DIAGNOSIS — R0789 Other chest pain: Secondary | ICD-10-CM | POA: Insufficient documentation

## 2020-02-23 DIAGNOSIS — I10 Essential (primary) hypertension: Secondary | ICD-10-CM | POA: Diagnosis not present

## 2020-02-23 DIAGNOSIS — Z20822 Contact with and (suspected) exposure to covid-19: Secondary | ICD-10-CM | POA: Diagnosis not present

## 2020-02-23 DIAGNOSIS — Z7982 Long term (current) use of aspirin: Secondary | ICD-10-CM | POA: Diagnosis not present

## 2020-02-23 DIAGNOSIS — Z87891 Personal history of nicotine dependence: Secondary | ICD-10-CM | POA: Insufficient documentation

## 2020-02-23 DIAGNOSIS — Z742 Need for assistance at home and no other household member able to render care: Secondary | ICD-10-CM | POA: Insufficient documentation

## 2020-02-23 DIAGNOSIS — W19XXXA Unspecified fall, initial encounter: Secondary | ICD-10-CM

## 2020-02-23 DIAGNOSIS — F4323 Adjustment disorder with mixed anxiety and depressed mood: Secondary | ICD-10-CM | POA: Insufficient documentation

## 2020-02-23 DIAGNOSIS — Z765 Malingerer [conscious simulation]: Secondary | ICD-10-CM

## 2020-02-23 DIAGNOSIS — R45851 Suicidal ideations: Secondary | ICD-10-CM | POA: Insufficient documentation

## 2020-02-23 DIAGNOSIS — Z5901 Sheltered homelessness: Secondary | ICD-10-CM | POA: Insufficient documentation

## 2020-02-23 DIAGNOSIS — Z79899 Other long term (current) drug therapy: Secondary | ICD-10-CM | POA: Diagnosis not present

## 2020-02-23 MED ORDER — HYDROCODONE-ACETAMINOPHEN 5-325 MG PO TABS
1.0000 | ORAL_TABLET | Freq: Once | ORAL | Status: DC
  Filled 2020-02-23: qty 1

## 2020-02-23 NOTE — ED Provider Notes (Signed)
[facility] EMERGENCY DEPARTMENT Provider Note   CSN: [id number] Arrival date & time: 02/23/20  1937     History Chief Complaint  Patient presents with  . Fall    Fred Thomas is a 85 y.o. male.  HPI     This is an 85 year old male with history of hypertension, hyperlipidemia who presents following a fall.  Patient reports that he fell 5 days ago 1/18 after he slipped on some [place] at the [place] [facility].  Since that time he states he has had difficulty getting out of bed secondary to pain.  Pain is worse with movement.  It is in the posterior right chest wall and back and right shoulder blade.  He reports pain with movement of his right arm and with sitting up.  Denies significant shortness of breath.  He is not on any blood thinners.  Denies hitting his head or loss of consciousness.  Denies pain elsewhere.  No weakness or numbness in the lower extremities.  Currently he rates his pain 6 out of 10  Past Medical History:  Diagnosis Date  . Anemia   . Blood transfusion   . Depression   . Hyperlipidemia   . Hypertension     Patient Active Problem List   Diagnosis Date Noted  . Syncope and collapse 06/24/2011  . Acute kidney injury (HCC) 06/24/2011  . Leukocytosis 06/24/2011  . Hypertension 06/24/2011  . Hyperlipidemia 06/24/2011  . Depression 06/24/2011    Past Surgical History:  Procedure Laterality Date  . TONSILLECTOMY AND ADENOIDECTOMY         No family history on file.  Social History   Tobacco Use  . Smoking status: Former Smoker    Years: 10.00    Types: Pipe  . Smokeless tobacco: Never Used  Substance Use Topics  . Alcohol use: No  . Drug use: No    Home Medications Prior to Admission medications   Medication Sig Start Date End Date Taking? Authorizing Provider  aspirin EC 81 MG tablet Take 81 mg by mouth daily.    [provider]  HYDROcodone-acetaminophen (NORCO) 5-325 MG tablet Take 0.5-1 tablets by mouth every 4 (four) hours  as needed (for pain; may cause constipation). 11/28/15   Molpus, Jonny Ruiz, MD  Multiple Vitamins-Minerals (MULTIVITAMINS THER. W/MINERALS) TABS Take 1 tablet by mouth daily.    [provider]  penicillin v potassium (VEETID) 500 MG tablet Take 1 tablet (500 mg total) by mouth 4 (four) times daily. 11/28/15   Molpus, John, MD  pravastatin (PRAVACHOL) 20 MG tablet Take 20 mg by mouth daily.    [provider]  venlafaxine XR (EFFEXOR-XR) 150 MG 24 hr capsule Take 150 mg by mouth daily.    [provider]    Allergies    Patient has no known allergies.  Review of Systems   Review of Systems  Constitutional: Negative for fever.  Respiratory: Negative for shortness of breath.   Cardiovascular: Positive for chest pain. Negative for leg swelling.  Gastrointestinal: Negative for nausea and vomiting.  Genitourinary: Negative for dysuria.  Musculoskeletal: Positive for back pain.  Neurological: Negative for weakness and numbness.  All other systems reviewed and are negative.   Physical Exam Updated Vital Signs BP (!) 145/84 (BP Location: Left Arm)   Pulse (!) 114   Temp 98.2 F (36.8 C) (Oral)   Resp 16   Ht 1.753 m (5\' 9" )   Wt 83.9 kg   SpO2 98%   BMI  27.32 kg/m   Physical Exam Vitals and nursing note reviewed.  Constitutional:      Appearance: He is well-developed and well-nourished.     Comments: Elderly, nontoxic-appearing, ABCs intact  HENT:     Head: Normocephalic and atraumatic.     Nose: Nose normal.     Mouth/Throat:     Mouth: Mucous membranes are moist.  Eyes:     Pupils: Pupils are equal, round, and reactive to light.  Cardiovascular:     Rate and Rhythm: Normal rate and regular rhythm.     Heart sounds: Normal heart sounds. No murmur heard.     Comments: Tenderness to palpation right posterior chest wall along the ribs, no overlying skin changes or crepitus noted Pulmonary:     Effort: Pulmonary effort is normal. No respiratory  distress.     Breath sounds: Normal breath sounds. No wheezing.  Abdominal:     General: Bowel sounds are normal.     Palpations: Abdomen is soft.     Tenderness: There is no abdominal tenderness. There is no rebound.  Musculoskeletal:        General: No edema.     Cervical back: Neck supple.     Right lower leg: No edema.     Left lower leg: No edema.  Lymphadenopathy:     Cervical: No cervical adenopathy.  Skin:    General: Skin is warm and dry.  Neurological:     Mental Status: He is alert and oriented to person, place, and time.  Psychiatric:        Mood and Affect: Mood and affect and mood normal.     ED Results / Procedures / Treatments   Labs (all labs ordered are listed, but only abnormal results are displayed) Labs Reviewed - No data to display  EKG None  Radiology No results found.  Procedures Procedures (including critical care time)  Medications Ordered in ED Medications  HYDROcodone-acetaminophen (NORCO/VICODIN) 5-325 MG per tablet 1 tablet (1 tablet Oral Patient Refused/Not Given 02/23/20 2332)    ED Course  I have reviewed the triage vital signs and the nursing notes.  Pertinent labs & imaging results that were available during my care of the patient were reviewed by me and considered in my medical decision making (see chart for details).  Clinical Course as of 02/24/20 0542  Mon Feb 24, 2020  0155 Patient continues to refuse x-ray and any work-up pain medicine I have offered him.  He now states that he feels like he is going to commit suicide if we do not let him nap.  He states he feels "so bad" that he just might need to commit suicide.  I discussed with him that I am trying to help him and that he is not allowing me to help him.  He has no plan of suicidality.  He states "just send me up to [facility] for suicide."  He reports that he is unhappy being at the [facility] and that he is not comfortable there. [CH]  0540 Spoke to psychiatry regarding TTS  evaluation to restratify.  I feel the patient is likely malingering.  He was seen approximately 6 months ago at behavioral health with similar presentation of suicidality without a plan.  Reportedly poor living conditions in a hotel.  At that time he was accompanied by his son.  Per TTS, reevaluation in the morning. [CH]    Clinical Course User Index [CH] Waunetta Riggle, Mayer Masker, MD   MDM  Rules/Calculators/A&P                          See clinical course above.  Initially, patient presents with pain following a fall 5 days ago.  He refused all testing including x-rays.  He is just requesting to sleep.  I discussed with him that we needed to rule out emergent conditions and that he would need to get an x-ray, patient states that he just wants to sleep.  I discussed with him that since he has a safe place to go, we would need to bed if he does not want further work-up.  He then stated that he was suicidal.  No plan.  Is very difficult to redirect and continue to refuse any medications or treatment.  Highly suspect malingering.  Will involve TTS as they evaluated him 6 months ago.  See clinical course above.  Final Clinical Impression(s) / ED Diagnoses Final diagnoses:  Fall, initial encounter  Malingering    Rx / DC Orders ED Discharge Orders    None       Glorianne Proctor, Mayer Masker, MD 02/24/20 [phone]

## 2020-02-23 NOTE — ED Triage Notes (Addendum)
Pt reports he fell on 1/18 on ice. States he is having pain in his back on the right side. He has not taken any medications. Pt states he lives at the [address]

## 2020-02-23 NOTE — ED Triage Notes (Signed)
Pt brought in by EMS from [facility]. Reports fall on ice 1 hour ago. C/o mid back pain. Pt able to stand and walk. HR 120 BP 130/80 O2 sats 99%

## 2020-02-24 ENCOUNTER — Encounter (HOSPITAL_BASED_OUTPATIENT_CLINIC_OR_DEPARTMENT_OTHER): Payer: Self-pay | Admitting: Registered Nurse

## 2020-02-24 ENCOUNTER — Emergency Department (HOSPITAL_BASED_OUTPATIENT_CLINIC_OR_DEPARTMENT_OTHER): Payer: Medicare Other

## 2020-02-24 DIAGNOSIS — F4323 Adjustment disorder with mixed anxiety and depressed mood: Secondary | ICD-10-CM

## 2020-02-24 DIAGNOSIS — R45851 Suicidal ideations: Secondary | ICD-10-CM

## 2020-02-24 DIAGNOSIS — R0789 Other chest pain: Secondary | ICD-10-CM | POA: Diagnosis not present

## 2020-02-24 DIAGNOSIS — Z789 Other specified health status: Secondary | ICD-10-CM

## 2020-02-24 LAB — RAPID URINE DRUG SCREEN, HOSP PERFORMED
Amphetamines: NOT DETECTED
Barbiturates: NOT DETECTED
Benzodiazepines: NOT DETECTED
Cocaine: NOT DETECTED
Opiates: NOT DETECTED
Tetrahydrocannabinol: NOT DETECTED

## 2020-02-24 LAB — CBC WITH DIFFERENTIAL/PLATELET
Abs Immature Granulocytes: 0.07 10*3/uL (ref 0.00–0.07)
Basophils Absolute: 0.1 10*3/uL (ref 0.0–0.1)
Basophils Relative: 0 %
Eosinophils Absolute: 0 10*3/uL (ref 0.0–0.5)
Eosinophils Relative: 0 %
HCT: 47.2 % (ref 39.0–52.0)
Hemoglobin: 15.9 g/dL (ref 13.0–17.0)
Immature Granulocytes: 1 %
Lymphocytes Relative: 16 %
Lymphs Abs: 2.1 10*3/uL (ref 0.7–4.0)
MCH: 29.9 pg (ref 26.0–34.0)
MCHC: 33.7 g/dL (ref 30.0–36.0)
MCV: 88.9 fL (ref 80.0–100.0)
Monocytes Absolute: 1.2 10*3/uL — ABNORMAL HIGH (ref 0.1–1.0)
Monocytes Relative: 9 %
Neutro Abs: 9.3 10*3/uL — ABNORMAL HIGH (ref 1.7–7.7)
Neutrophils Relative %: 74 %
Platelets: 347 10*3/uL (ref 150–400)
RBC: 5.31 MIL/uL (ref 4.22–5.81)
RDW: 13.2 % (ref 11.5–15.5)
WBC: 12.7 10*3/uL — ABNORMAL HIGH (ref 4.0–10.5)
nRBC: 0 % (ref 0.0–0.2)

## 2020-02-24 LAB — URINALYSIS, ROUTINE W REFLEX MICROSCOPIC
Glucose, UA: NEGATIVE mg/dL
Hgb urine dipstick: NEGATIVE
Ketones, ur: 40 mg/dL — AB
Leukocytes,Ua: NEGATIVE
Nitrite: NEGATIVE
Protein, ur: NEGATIVE mg/dL
Specific Gravity, Urine: 1.03 (ref 1.005–1.030)
pH: 5 (ref 5.0–8.0)

## 2020-02-24 LAB — COMPREHENSIVE METABOLIC PANEL
ALT: 42 U/L (ref 0–44)
AST: 39 U/L (ref 15–41)
Albumin: 3.8 g/dL (ref 3.5–5.0)
Alkaline Phosphatase: 60 U/L (ref 38–126)
Anion gap: 12 (ref 5–15)
BUN: 31 mg/dL — ABNORMAL HIGH (ref 8–23)
CO2: 25 mmol/L (ref 22–32)
Calcium: 8.7 mg/dL — ABNORMAL LOW (ref 8.9–10.3)
Chloride: 99 mmol/L (ref 98–111)
Creatinine, Ser: 1.44 mg/dL — ABNORMAL HIGH (ref 0.61–1.24)
GFR, Estimated: 49 mL/min — ABNORMAL LOW (ref >60)
Glucose, Bld: 104 mg/dL — ABNORMAL HIGH (ref 70–99)
Potassium: 3.7 mmol/L (ref 3.5–5.1)
Sodium: 136 mmol/L (ref 135–145)
Total Bilirubin: 1.6 mg/dL — ABNORMAL HIGH (ref 0.3–1.2)
Total Protein: 6.9 g/dL (ref 6.5–8.1)

## 2020-02-24 LAB — SARS CORONAVIRUS 2 BY RT PCR (HOSPITAL ORDER, PERFORMED IN ~~LOC~~ HOSPITAL LAB): SARS Coronavirus 2: NEGATIVE

## 2020-02-24 MED ORDER — VENLAFAXINE HCL ER 75 MG PO CP24
150.0000 mg | ORAL_CAPSULE | Freq: Every day | ORAL | Status: DC
  Administered 2020-02-25: 150 mg via ORAL
  Filled 2020-02-24 (×2): qty 2
  Filled 2020-02-24: qty 1

## 2020-02-24 NOTE — ED Notes (Signed)
When asked if pt had thoughts of harming self he answered "not but if I do I will call you, Sheralyn Boatman"

## 2020-02-24 NOTE — BH Assessment (Signed)
Comprehensive Clinical Assessment (CCA) Note  02/24/2020 Fred Thomas [id number]  Chief Complaint:  Chief Complaint  Patient presents with  . Fall   Visit Diagnosis: F60.3, Borderline personality disorder   CCA Screening, Triage and Referral (STR) Fred Thomas is an 85 year old patient who was [facility] via EMS after he called 911 due to falling on ice. Per pt notes, pt refused treatment and told his nurses that he just wanted to sleep; when informed that pt could not just sleep and that, if he did not need treatment he would be d/c, he told them he needed to go to [facility] and "I'll just have to say I'm suicidal. Call them and tell them you have a suicidal patient."  Pt was talkative throughout the assessment but had difficulties answering the questions posed. When asked why he was brought to the hospital, he stated, "I'm just sick. I have lost control of my life and I am considering suicide *only*. I am here is why I've lost control of my life. I was successful and now I'm a failure." When asked if pt had a plan to kill himself he stated, "I want to go to [facility] and be admitted to the Suicide Risk. I have no money so I'm planning to hurt myself - so I want to kill myself." When asked if pt had attempted to kill himself in the past he stated, "many many times. [The last time was] when I called 911 I was at the point of death or help." He stated this incident was recently but did not verbalize that it was last night. When asked if pt had been hospitalized in the past for mental health concerns he stated, "yes, but it was not overnight; I was not, at that point, committed to high risk life."  Pt denies HI and access to guns; he states he has access to knives. Pt states he has not used substances in 40 years. When asked if he has been experiencing AVH he states, "I do not know." When asked about engagement in NSSIB he replied, "..... uh, high risk past.I'm losing control of my  mind at this point." When asked if pt is engaged in the legal system, he states, yes, very big troubles with the law. All my money has been used to pay off previous debts." He denies he currently has a therapist or a psychiatrist. Pt has a hx within the [facility] computer system of having a behavioral health assessment completed on 06/02/2019; at that time it was determined pt did not meet inpatient criteria.  When asked if pt had anything else that was important for clinician to know, he stated, "send me to [facility] Suicide Risk Area." When clinician posed the question as to what we should do if we're unable to take him at this time (due to not meeting criteria, no appropriate beds, etc), he stated, "I don't know."  Pt's protective factors include his ability to communicate with others.  Pt declined to provide consent for clinician to contact friends/family for collateral information.  Pt is oriented x5. His recent/remote memory is intact. Pt was cooperative throughout the assessment, though talkative and, at times, did not answer the question posed. Pt's insight, judgement, and impulse control is fair at this point.   Recommendations for Services/Supports/Treatments: Melbourne Abtsody Taylor, PA, reviewed pt's chart and information and determined pt should be observed overnight for safety and stability. This information was given to pt's providers at 0533.   Patient  Reported Information How did you hear about [place]? Self  Referral name: Pt requested an assessment by [facility]  Referral phone number: 0 (N/A)   Whom do you see for routine medical problems? Hospital ER  Practice/Facility Name: Various  Practice/Facility Phone Number: [phone] (Unknown)  Name of Contact: No data recorded Contact Number: No data recorded Contact Fax Number: No data recorded Prescriber Name: Various  Prescriber Address (if known): Unknown   What Is the Reason for Your Visit/Call Today? Pt states he's lost control of his life  and that he's "considering suicide only."  How Long Has This Been Causing You Problems? <Week  What Do You Feel Would Help You the Most Today? Other (Comment) (Pt has requested either overnight observation or inpatient services)   Have You Recently Been in Any Inpatient Treatment (Hospital/Detox/Crisis Center/28-Day Program)? No  Name/Location of Program/Hospital:No data recorded How Long Were You There? No data recorded When Were You Discharged? No data recorded  Have You Ever Received Services From [facility] Before? Yes  Who Do You See at [facility]? Pt had a BH Assessment done 06/02/2019   Have You Recently Had Any Thoughts About Hurting Yourself? Yes  Are You Planning to Commit Suicide/Harm Yourself At This time? -- (Pt states that he will kill himself if he is d/c from the hospital.)   Have you Recently Had Thoughts About Hurting Someone Karolee Ohs? No  Explanation: No data recorded  Have You Used Any Alcohol or Drugs in the Past 24 Hours? No  How Long Ago Did You Use Drugs or Alcohol? No data recorded What Did You Use and How Much? No data recorded  Do You Currently Have a Therapist/Psychiatrist? No  Name of Therapist/Psychiatrist: No data recorded  Have You Been Recently Discharged From Any Office Practice or Programs? No  Explanation of Discharge From Practice/Program: No data recorded    CCA Screening Triage Referral Assessment Type of Contact: Tele-Assessment  Is this Initial or Reassessment? Initial Assessment  Date Telepsych consult ordered in [facility]:  02/24/2020  Time Telepsych consult ordered in [facility]:  0156   Patient Reported Information Reviewed? Yes  Patient Left Without Being Seen? No data recorded Reason for Not Completing Assessment: No data recorded  Collateral Involvement: Pt declined to provide verbal consent for clinician to make contact with friends/family for collateral information.   Does Patient Have a Automotive engineer Guardian? No  data recorded Name and Contact of Legal Guardian: No data recorded If Minor and Not Living with Parent(s), Who has Custody? N/A  Is CPS involved or ever been involved? Never  Is APS involved or ever been involved? Never   Patient Determined To Be At Risk for Harm To Self or Others Based on Review of Patient Reported Information or Presenting Complaint? Yes, for Self-Harm  Method: No data recorded Availability of Means: No data recorded Intent: No data recorded Notification Required: No data recorded Additional Information for Danger to Others Potential: No data recorded Additional Comments for Danger to Others Potential: No data recorded Are There Guns or Other Weapons in Your Home? No data recorded Types of Guns/Weapons: No data recorded Are These Weapons Safely Secured?                            No data recorded Who Could Verify You Are Able To Have These Secured: No data recorded Do You Have any Outstanding Charges, Pending Court Dates, Parole/Probation? No data recorded Contacted  To Inform of Risk of Harm To Self or Others: Unable to Contact:   Location of Assessment: [facility]   Does Patient Present under Involuntary Commitment? No  IVC Papers Initial File Date: No data recorded  [place] of Residence: [place]   Patient Currently Receiving the Following Services: Not Receiving Services   Determination of Need: Emergent (2 hours)   Options For Referral: -- Melbourne Abts, PA, determined pt should be observed overnight and re-assessed in the morning.)     CCA Biopsychosocial Intake/Chief Complaint:  Pt states he's lost control of his life and that he's "considering suicide only."  Current Symptoms/Problems: Pt states, "I have no money so I'm planning to hurt myself, so I want to kill myself."   Patient Reported Schizophrenia/Schizoaffective Diagnosis in Past: No   Strengths: Pt is able to verbalize his needs.  Preferences: Pt would like to be  observed overnight or admitted to [facility].  Abilities: Pt has been living independently at a motel.   Type of Services Patient Feels are Needed: Pt would like to receive overnight observation or inpatient services.   Initial Clinical Notes/Concerns: It was noted that pt did not share any concerns re: SI until d/c from the ED was mentioned.   Mental Health Symptoms Depression:  Irritability   Duration of Depressive symptoms: Greater than two weeks   Mania:  None   Anxiety:   None   Psychosis:  None   Duration of Psychotic symptoms: No data recorded  Trauma:  None   Obsessions:  None   Compulsions:  None   Inattention:  None   Hyperactivity/Impulsivity:  N/A   Oppositional/Defiant Behaviors:  None   Emotional Irregularity:  Mood lability; Potentially harmful impulsivity; Recurrent suicidal behaviors/gestures/threats   Other Mood/Personality Symptoms:  None noted    Mental Status Exam Appearance and self-care  Stature:  Average   Weight:  Average weight   Clothing:  Age-appropriate   Grooming:  Normal   Cosmetic use:  None   Posture/gait:  Other (Comment) (Pt is lying down in his hospital room)   Motor activity:  Not Remarkable   Sensorium  Attention:  Normal   Concentration:  Normal   Orientation:  X5   Recall/memory:  Normal   Affect and Mood  Affect:  Full Range   Mood:  Anxious   Relating  Eye contact:  Normal   Facial expression:  Responsive   Attitude toward examiner:  Manipulative   Thought and Language  Speech flow: Clear and Coherent   Thought content:  Appropriate to Mood and Circumstances   Preoccupation:  None   Hallucinations:  None   Organization:  No data recorded  [employer] of Knowledge:  Average   Intelligence:  Average   Abstraction:  Abstract   Judgement:  Impaired   Reality Testing:  Variable   Insight:  Gaps   Decision Making:  Impulsive   Social Functioning  Social Maturity:   Impulsive; Irresponsible   Social Judgement:  Naive   Stress  Stressors:  Grief/losses; Housing; Surveyor, quantity; Transitions   Coping Ability:  Exhausted   Skill Deficits:  Activities of daily living; Decision making; Responsibility; Self-control   Supports:  Family     Religion: Religion/Spirituality Are You A Religious Person?:  (N/A) How Might This Affect Treatment?: N/A  Leisure/Recreation: Leisure / Recreation Do You Have Hobbies?:  (N/A)  Exercise/Diet: Exercise/Diet Do You Exercise?:  (N/A) Have You Gained or Lost A Significant Amount of Weight  in the Past Six Months?:  (N/A) Do You Follow a Special Diet?:  (N/A) Do You Have Any Trouble Sleeping?:  (N/A)   CCA Employment/Education Employment/Work Situation: Employment / Work Situation Employment situation:  (N/A) Patient's job has been impacted by current illness:  (N/A) What is the longest time patient has a held a job?: N/A Where was the patient employed at that time?: N/A Has patient ever been in the [employer]?:  (N/A)  Education: Education Is Patient Currently Attending School?: No Last Grade Completed:  (N/A) Name of High School: N/A Did Garment/textile technologist From [employer]?:  (N/A) Did You Attend College?:  (N/A) Did You Attend Graduate School?:  (N/A) Did You Have Any Special Interests In School?: N/A Did You Have An Individualized Education Program (IIEP):  (N/A) Did You Have Any Difficulty At School?:  (N/A) Patient's Education Has Been Impacted by Current Illness:  (N/A)   CCA Family/Childhood History Family and Relationship History: Family history Marital status:  (N/A) Are you sexually active?:  (N/A) What is your sexual orientation?: N/A Has your sexual activity been affected by drugs, alcohol, medication, or emotional stress?: N/A Does patient have children?: Yes How many children?:  (N/A) How is patient's relationship with their children?: N/A  Childhood History:  Childhood History By whom  was/is the patient raised?:  (N/A) Additional childhood history information: N/A Description of patient's relationship with caregiver when they were a child: N/A Patient's description of current relationship with people who raised him/her: N/A How were you disciplined when you got in trouble as a child/adolescent?: N/A Does patient have siblings?:  (N/A) Did patient suffer any verbal/emotional/physical/sexual abuse as a child?:  (N/A) Did patient suffer from severe childhood neglect?:  (N/A) Has patient ever been sexually abused/assaulted/raped as an adolescent or adult?:  (N/A) Was the patient ever a victim of a crime or a disaster?:  (N/A) Witnessed domestic violence?:  (N/A) Has patient been affected by domestic violence as an adult?:  (N/A)  Child/Adolescent Assessment:     CCA Substance Use Alcohol/Drug Use: Alcohol / Drug Use Pain Medications: Please see MAR Prescriptions: Please see MAR Over the Counter: Please see MAR History of alcohol / drug use?: No history of alcohol / drug abuse Longest period of sobriety (when/how long): Pt states he has not used any substances in 40 years Negative Consequences of Use:  (N/A) Withdrawal Symptoms:  (N/A)                         ASAM's:  Six Dimensions of Multidimensional Assessment  Dimension 1:  Acute Intoxication and/or Withdrawal Potential:      Dimension 2:  Biomedical Conditions and Complications:      Dimension 3:  Emotional, Behavioral, or Cognitive Conditions and Complications:     Dimension 4:  Readiness to Change:     Dimension 5:  Relapse, Continued use, or Continued Problem Potential:     Dimension 6:  Recovery/Living Environment:     ASAM Severity Score:    ASAM Recommended Level of Treatment:     Substance use Disorder (SUD)    Recommendations for Services/Supports/Treatments: Melbourne Abts, PA, reviewed pt's chart and information and determined pt should be observed overnight for safety and stability.  This information was given to pt's providers at 0533.  DSM5 Diagnoses: Patient Active Problem List   Diagnosis Date Noted  . Syncope and collapse 06/24/2011  . Acute kidney injury (HCC) 06/24/2011  . Leukocytosis 06/24/2011  .  Hypertension 06/24/2011  . Hyperlipidemia 06/24/2011  . Depression 06/24/2011    Patient Centered Plan: Patient is on the following Treatment Plan(s):  Impulse Control   Referrals to Alternative Service(s): Referred to Alternative Service(s):   Place:   Date:   Time:    Referred to Alternative Service(s):   Place:   Date:   Time:    Referred to Alternative Service(s):   Place:   Date:   Time:    Referred to Alternative Service(s):   Place:   Date:   Time:     Ralph DowdySamantha L Kinzie Wickes, LMFT

## 2020-02-24 NOTE — ED Notes (Signed)
Pt for transfer ED to ED [facility] for psych eval/hold.  Dr Lynelle Doctor accepting for voluntary admission.

## 2020-02-24 NOTE — ED Notes (Signed)
RN to room to check on patient and ask about XR again. Pt informed RN "I cant do the x-ray. If Fred Thomas can't help me then Ill just have to say I'm suicidal. Call them and tell them you have a suicidal patient". EDP notifed.

## 2020-02-24 NOTE — ED Notes (Signed)
Pt transported via care transport to [facility] ER for eval.

## 2020-02-24 NOTE — ED Notes (Signed)
TTS eval completed.  Pt states not suicidal or homicidal at this time, but his living situation is concerning, states "I used to be self sufficient and working but I can't take care of myself anymore and that makes me want to kill myself.  I don't have any family or friends to help, I live in a hotel and I don't bathe as much as I should, and I don't have anyone to ask for help"  [facility] but also acknowledges he does have suicidal thoughts as soon as he goes back to his living situation.  Psych evaluator to discuss with provider for plan.

## 2020-02-24 NOTE — ED Notes (Signed)
Verified safe care transport able to transfer to [facility] ER with assistance getting out of vehicle there.

## 2020-02-24 NOTE — Progress Notes (Signed)
TOC CM/CSW reviewed chart.  Pt is recommended for geropsychiatric inpatient treatment.  Please reconsult if new social work issues arise, CSW signing off.  CSW will continue to follow for dc needs.  Jaymar Loeber Tarpley-Carter, MSW, LCSW-A Pronouns:  She, Her, Hers                  [name] ED Transitions of CareClinical Social Worker [email or web address] [phone]

## 2020-02-24 NOTE — ED Notes (Signed)
Pt here from [facility] for SW eval. Pt currently in [place] [facility] resting comfortably.

## 2020-02-24 NOTE — ED Notes (Signed)
Hourly rounding, patient stated he needs to go to [facility] to get an xray. This nurse informed patient that he could get an xray here. Patient states he just needs to go to [facility] or sleep. Informed patient that he could be treated here. Patient requesting the lights off and to sleep.

## 2020-02-24 NOTE — BH Assessment (Addendum)
Per Assunta Found, NP, this pt requires psychiatric hospitalization at a facility providing specialty care for geriatric patients at this time.  The following facilities have been contacted to seek placement for this pt, with results as noted:  Beds available, information sent, decision pending: [facility] [employer] [facility]  Unable to reach: [facility] (left message at 16:14) [facility] (left message at 16:23)  At capacity: Earlene Plater (under [employer]) Mannie Stabile (under [employer])  If this voluntary pt is accepted to a facility, please discuss disposition with pt to be sure that he agrees to the plan.  If a facility agrees to accept pt and the plan changes in any way please call the facility to inform them of the change.  Final disposition is pending as of this writing.   Doylene Canning, [place] Behavioral Health Coordinator [phone]

## 2020-02-24 NOTE — Psych (Signed)
Per Amy at [facility], no open beds.

## 2020-02-24 NOTE — ED Notes (Signed)
Pt requesting RN turn off lights and "I'll see you in the morning".

## 2020-02-24 NOTE — ED Notes (Addendum)
Pt wanded by security. No objects noted. After completing psych assessment patient asked "Did I do good? Did I answer right?"

## 2020-02-24 NOTE — ED Notes (Signed)
XR to bedside to attempt to get pt for XR. Pt refused to remove his clothing and several belongings he has strapped to his chest. Pt told XR that he cannot remove his clothing or belongings due to pain. RN to room to discuss pain medication that was just offered to the patient, that he declined. Pt maintains that he does not want the XR and just wants to be left to sleep a few hours stating: "can I just do the x-ray later, I haven't slept in 4 days and I need to get some sleep". At this poin the asked the RN to turn out the lights and "I'll see you in the morning". Patient notified that he cannot use the Emergency Department for a nap as there are other patients that have been waiting to be seen by a doctor. Pt continued to ask for a nap. EDP notified.

## 2020-02-24 NOTE — ED Notes (Signed)
Pt agrees to transport to [facility] for psych eval, contracts for safety.

## 2020-02-24 NOTE — Consult Note (Signed)
Telepsych Consultation   Reason for Consult:  Suicidal ideation Referring Physician:  Shon Baton, MD Location of Patient: [facility] Location of Provider: Other: [facility]  Patient Identification: Fred Thomas MRN:  [id number] Principal Diagnosis: Acute adjustment disorder with mixed anxiety and depressed mood Diagnosis:  Principal Problem:   Acute adjustment disorder with mixed anxiety and depressed mood Active Problems:   Self-care deficit in patient living alone   Suicidal ideations   Total Time spent with patient: 30 minutes  Subjective:   Fred Thomas is a 85 y.o. male patient admitted to [facility] with complaints of a fall.  After medically cleared and ready for discharge patient stated he wanted to stay and nap when told could not just stay and nap patient stated he was suicidal.    HPI:  Fred Thomas, 85 y.o., male patient seen via tele psych by this provider, consulted with Dr. Lucianne Muss; and chart reviewed on 02/24/20.  Unable to visible see patient on tele health machine related to [facility] camera not working at time of assessment; so assessment was done through verbal communication only with no camera.  On evaluation Fred Thomas reports he initially came to the emergency room because of a fall."I fell on the ice January 18th."  At this time patient states he is not having any suicidal thoughts because "I've been sleeping and not having thoughts of hurting myself when sleeping but fear of hurting myself."  Patient asked what was causing him to have thoughts of hurting himself and he stated "I'm not at this very moment because you are helping me, but I am looking at the big picture.  I want to go to [facility] you know to place to go when you want her to self.  I need to get help.  I do everything I can to not hurt myself but I cannot take care of myself at home."  Patient is reporting that he lives alone in a motel room ([address]) where he has lived for the last 5 years.  Patient  reports that he has no family or friends or any support system.  He states that he is no longer able to care for himself at home and that is the main reason for the thoughts of hurting himself.  Patient states he does not want to go back to the motel because he is tired of living there.  Patient states that he is not hearing or seeing things that other people cannot, he also denies homicidal ideation, and paranoia.  Patient feels if he is sent to behavioral health he can get assistance with housing and depression. During evaluation Fred Thomas does not sound to be in acute distress.  He is alert, oriented x 4, calm and cooperative.  His mood is anxious and depressed.  He does not appear to be responding to internal/external stimuli or delusional thoughts.  Patient denies homicidal ideation, psychosis, and paranoia.  Patient's suicidal thoughts appears to be more secondary gain related to not wanting to go back to his motel room because he is unable to care for himself as he did at one time.  Patient also reported he has no support system.  Patient also reported that he has no guns in his home.  Patient answered question appropriately.  Past Psychiatric History: Depression  Risk to Self:  yes Risk to Others:  No Prior Inpatient Therapy:  No Prior Outpatient Therapy:  No  Past Medical History:  Past Medical History:  Diagnosis Date  . Anemia   . Blood transfusion   . Depression   . Hyperlipidemia   . Hypertension     Past Surgical History:  Procedure Laterality Date  . TONSILLECTOMY AND ADENOIDECTOMY     Family History: History reviewed. No pertinent family history. Family Psychiatric  History: None reported Social History:  Social History   Substance and Sexual Activity  Alcohol Use No     Social History   Substance and Sexual Activity  Drug Use No    Social History   Socioeconomic History  . Marital status: Divorced    Spouse name: Not on file  . Number of children: Not  on file  . Years of education: Not on file  . Highest education level: Not on file  Occupational History  . Not on file  Tobacco Use  . Smoking status: Former Smoker    Years: 10.00    Types: Pipe  . Smokeless tobacco: Never Used  Substance and Sexual Activity  . Alcohol use: No  . Drug use: No  . Sexual activity: Not on file  Other Topics Concern  . Not on file  Social History Narrative  . Not on file   Social Determinants of Health   Financial Resource Strain: Not on file  Food Insecurity: Not on file  Transportation Needs: Not on file  Physical Activity: Not on file  Stress: Not on file  Social Connections: Not on file   Additional Social History:    Allergies:  No Known Allergies  Labs: No results found for this or any previous visit (from the past 48 hour(s)).  Medications:  No current facility-administered medications for this encounter.   Current Outpatient Medications  Medication Sig Dispense Refill  . aspirin EC 81 MG tablet Take 81 mg by mouth daily.    Marland Kitchen HYDROcodone-acetaminophen (NORCO) 5-325 MG tablet Take 0.5-1 tablets by mouth every 4 (four) hours as needed (for pain; may cause constipation). 10 tablet 0  . Multiple Vitamins-Minerals (MULTIVITAMINS THER. W/MINERALS) TABS Take 1 tablet by mouth daily.    . penicillin v potassium (VEETID) 500 MG tablet Take 1 tablet (500 mg total) by mouth 4 (four) times daily. 28 tablet 0  . pravastatin (PRAVACHOL) 20 MG tablet Take 20 mg by mouth daily.    Marland Kitchen venlafaxine XR (EFFEXOR-XR) 150 MG 24 hr capsule Take 150 mg by mouth daily.      Musculoskeletal: Strength & Muscle Tone: Unable to determine via Tele health Gait & Station: Did not see patient ambulate Patient leans: N/A  Psychiatric Specialty Exam: Physical Exam Vitals and nursing note reviewed.  Psychiatric:        Attention and Perception: Attention and perception normal. He does not perceive auditory or visual hallucinations.        Mood and Affect:  Mood is anxious and depressed.        Speech: Speech normal.        Behavior: Behavior normal. Behavior is cooperative.        Thought Content: Thought content is not paranoid or delusional. Thought content does not include homicidal ideation. Suicidal: Passive; secondary to not wanting to go home unable to care for self.        Cognition and Memory: Cognition and memory normal.        Judgment: Judgment is impulsive.     Review of Systems  Constitutional: Negative.   HENT: Negative.   Eyes: Negative.   Respiratory: Negative.  Cardiovascular: Negative.   Gastrointestinal: Negative.   Genitourinary: Negative.   Musculoskeletal: Positive for arthralgias.  Skin: Negative.   Neurological: Negative.   Hematological: Negative.   Psychiatric/Behavioral: Negative for agitation, behavioral problems and hallucinations. Suicidal ideas: Passive; secondary. The patient is nervous/anxious.        Patient reporting he lives alone in a motel and is no longer able to care for himself like he use to and has not family or support.  Suicidal related to not being able to care for himself and not wanting to go back to the motel      Blood pressure 127/80, pulse 86, temperature 98.2 F (36.8 C), temperature source Oral, resp. rate 16, height 5\' 9"  (1.753 m), weight 83.9 kg, SpO2 96 %.Body mass index is 27.32 kg/m.  General Appearance: Casual  Eye Contact:  verbal communication only unable to see patient  Speech:  Clear and Coherent and Normal Rate  Volume:  Normal  Mood:  Anxious and Depressed  Affect:  Congruent  Thought Process:  Coherent, Goal Directed, Linear and Descriptions of Associations: Circumstantial  Orientation:  Full (Time, Place, and Person)  Thought Content:  WDL  Suicidal Thoughts:  Yes.  without intent/plan  Homicidal Thoughts:  No  Memory:  Immediate;   Fair Recent;   Fair  Judgement:  Fair  Insight:  Fair and Lacking  Psychomotor Activity:  Normal  Concentration:   Concentration: Fair and Attention Span: Fair  Recall:  Good  Fund of Knowledge:  Fair  Language:  Good  Akathisia:  No  Handed:  Right  AIMS (if indicated):     Assets:  Communication Skills Desire for Improvement Financial Resources/Insurance  ADL's:  Intact  Cognition:  WNL  Sleep:      Patient unable to care for self at home; reporting decrease in personal care and ADL; patient not feeling safe at home alone and has no family/friends for support.  Patient reporting suicidal ideation with no specific plan or intent but if he is sent home alone he doesn't feel safe and feels that he will do something to hurt or kill himself.    Treatment Plan Summary: Daily contact with patient to assess and evaluate symptoms and progress in treatment, Medication management and Plan Inpatient psychiatric treatment  Restarted Effexor XR 150 mg daily   Disposition: Recommend psychiatric Inpatient admission when medically cleared.  This service was provided via telemedicine using a 2-way, interactive audio and video technology.  Names of all persons participating in this telemedicine service and their role in this encounter. Name: Role: NP  Name: Dr. Assunta Found Role: Psychiatrist  Name: Fred Thomas Role: Patient  Name:  Role:    Spoke with Dr. Suzy Bouchard [facility] ED[facility]) and Dr. [facility]) via telephone and secure message informed:  Patient recommended for geropsychiatric inpatient treatment.  I've spoke to Dr. [facility] and he is aware that patient will need to be transferred to [facility] ED while awaiting placement.   Patient will need labs, COVID, along with urinalysis and chest x ray for placement  Adaline Trejos, NP 02/24/2020 10:09 AM

## 2020-02-24 NOTE — ED Notes (Signed)
Pt assisted to change into psych scrubs, belongings removed from room. Pt denies pain when laying in bed. Respirations even and unlabored. No acute distress noted.

## 2020-02-25 ENCOUNTER — Encounter (HOSPITAL_COMMUNITY): Payer: Self-pay | Admitting: Registered Nurse

## 2020-02-25 DIAGNOSIS — R0789 Other chest pain: Secondary | ICD-10-CM | POA: Diagnosis not present

## 2020-02-25 MED ORDER — QUETIAPINE FUMARATE 50 MG PO TABS
50.0000 mg | ORAL_TABLET | Freq: Two times a day (BID) | ORAL | Status: DC
  Administered 2020-02-25 – 2020-02-27 (×4): 50 mg via ORAL
  Filled 2020-02-25 (×4): qty 1

## 2020-02-25 MED ORDER — VENLAFAXINE HCL ER 75 MG PO CP24
75.0000 mg | ORAL_CAPSULE | Freq: Every day | ORAL | Status: DC
  Administered 2020-02-26 – 2020-03-05 (×7): 75 mg via ORAL
  Filled 2020-02-25 (×7): qty 1

## 2020-02-25 NOTE — BH Assessment (Addendum)
[facility] Assessment Progress Note  Per Shuvon Rankin, NP, this voluntary pt continues to require psychiatric hospitalization at this time.  The following facilities have been contacted to seek placement for this pt, with results as noted:  Beds available or anticipated, information sent, decision pending: [facility] (still under review) [facility] (still under review)  Under Covid-19 quarantine: [facility]  At capacity: [facility] (unit currently closed) [facility] (unit currently closed) [facility] (unit currently closed due to Covid) [facility]  Declined: [facility] (lack of criteria)   If this voluntary pt is accepted to a facility, please discuss disposition with pt to be sure that he agrees to the plan.  If a facility agrees to accept pt and the plan changes in any way please call the facility to inform them of the change.  Final disposition is pending as of this writing.  Doylene Canning, [place] [facility] Coordinator [phone]

## 2020-02-25 NOTE — Consult Note (Addendum)
[facility] Psych ED Progress Note  02/25/2020 10:10 AM Fred Thomas  MRN:  [id number]   Reason for Consult:  Altered mental status Referring Physician:  Lucien Mons EDP Location of Patient: [facility] ED Location of Provider:  Other: [facility]   Subjective:  "I don't feel well.  Everything is wrong.  I fell on ice.  I don't want to do this right now can we do it later."  Fred Thomas, 85 y.o., male patient seen via tele health by this provider, consulted with Dr. Lucianne Muss; and chart reviewed on 02/25/20.  On evaluation Fred Thomas reports he is not feeling well this morning and wants to sleep.  Patient is hard of hearing but has a hearing aid in left ear; volume was turned up but patient continued to stated that he could not hear.  Nursing repeated the questions to patient and he still stated he could not hear and would close his eyes until nurse shook him.  "I can't hear you, can we do this later, I don't feel like it right now."  Nurse informed patient that he was hearing just fine a little while ago but patient continued to say he could not hear her.  Nursing reports that patient talks and hears fine when he wants to.  States that patient has made no comments about being suicidal or self harm unless he is asked to do something he doesn't want.  Reports that patient did take his medications but has continued to refuse chest x-ray.     During evaluation Fred Thomas is laying in bed with a towel over his face in no acute distress.  He is alert, oriented x 4.  Patient has been calm but uncooperative with some aspects of care.  His mood is anxious with congruent affect.  He does not appear to be responding to internal/external stimuli or delusional thoughts.  Patient denies homicidal ideation, psychosis, and paranoia.  Patient would not answer the question if he was having suicidal thoughts; but has made statement of having suicidal thoughts or self harming thoughts if he is going home or having to do something  he doesn't want.  Nursing reported that patient son called wanting update on father.  Patients' son Fred Thomas at [phone].  Collateral Information:  Spoke to patients who informs that he feels that his father does have some mental health issues and needs to be medication and inpatient treatment especially if he is at risk of hurting himself.  States that his father has lived "in a dingy motel for the last 8 years and he doesn't like to see his family much because of his living situation."  States he checks on his father once every 1-2 months; but father doesn't like for them to come around.  States that his father is ashamed related to once having money and successful career.  States he feels that his father will need an assisted living or nursing facility but feel it would be better for psychiatric admission first to address the depression and suicidal thoughts.  States he can be reached at anytime if his help is needed or more information was needed.     Principal Problem: Acute adjustment disorder with mixed anxiety and depressed mood Diagnosis:  Principal Problem:   Acute adjustment disorder with mixed anxiety and depressed mood Active Problems:   Self-care deficit in patient living alone   Suicidal ideations  Total Time spent with patient: 30 minutes  Past Psychiatric History: Depression.  Past Medical History:  Past Medical History:  Diagnosis Date   Anemia    Blood transfusion    Depression    Hyperlipidemia    Hypertension     Past Surgical History:  Procedure Laterality Date   TONSILLECTOMY AND ADENOIDECTOMY     Family History: History reviewed. No pertinent family history. Family Psychiatric  History: None reported Social History:  Social History   Substance and Sexual Activity  Alcohol Use No     Social History   Substance and Sexual Activity  Drug Use No    Social History   Socioeconomic History   Marital status: Divorced    Spouse name:  Not on file   Number of children: Not on file   Years of education: Not on file   Highest education level: Not on file  Occupational History   Not on file  Tobacco Use   Smoking status: Former Smoker    Years: 10.00    Types: Pipe   Smokeless tobacco: Never Used  Substance and Sexual Activity   Alcohol use: No   Drug use: No   Sexual activity: Not on file  Other Topics Concern   Not on file  Social History Narrative   Not on file   Social Determinants of Health   Financial Resource Strain: Not on file  Food Insecurity: Not on file  Transportation Needs: Not on file  Physical Activity: Not on file  Stress: Not on file  Social Connections: Not on file    Sleep: Good  Appetite:  Good  Current Medications: Current Facility-Administered Medications  Medication Dose Route Frequency Provider Last Rate Last Admin   venlafaxine XR (EFFEXOR-XR) 24 hr capsule 150 mg  150 mg Oral Daily Mardelle Pandolfi B, NP   150 mg at 02/25/20 0813   Current Outpatient Medications  Medication Sig Dispense Refill   aspirin EC 81 MG tablet Take 81 mg by mouth daily.     HYDROcodone-acetaminophen (NORCO) 5-325 MG tablet Take 0.5-1 tablets by mouth every 4 (four) hours as needed (for pain; may cause constipation). 10 tablet 0   Multiple Vitamins-Minerals (MULTIVITAMINS THER. W/MINERALS) TABS Take 1 tablet by mouth daily.     penicillin v potassium (VEETID) 500 MG tablet Take 1 tablet (500 mg total) by mouth 4 (four) times daily. 28 tablet 0   pravastatin (PRAVACHOL) 20 MG tablet Take 20 mg by mouth daily.     venlafaxine XR (EFFEXOR-XR) 150 MG 24 hr capsule Take 150 mg by mouth daily.      Lab Results:  Results for orders placed or performed during the hospital encounter of 02/23/20 (from the past 48 hour(s))  CBC with Differential     Status: Abnormal   Collection Time: 02/24/20 12:09 PM  Result Value Ref Range   WBC 12.7 (H) 4.0 - 10.5 K/uL   RBC 5.31 4.22 - 5.81 MIL/uL    Hemoglobin 15.9 13.0 - 17.0 g/dL   HCT 40.0 86.7 - 61.9 %   MCV 88.9 80.0 - 100.0 fL   MCH 29.9 26.0 - 34.0 pg   MCHC 33.7 30.0 - 36.0 g/dL   RDW 50.9 32.6 - 71.2 %   Platelets 347 150 - 400 K/uL   nRBC 0.0 0.0 - 0.2 %   Neutrophils Relative % 74 %   Neutro Abs 9.3 (H) 1.7 - 7.7 K/uL   Lymphocytes Relative 16 %   Lymphs Abs 2.1 0.7 - 4.0 K/uL   Monocytes Relative 9 %  Monocytes Absolute 1.2 (H) 0.1 - 1.0 K/uL   Eosinophils Relative 0 %   Eosinophils Absolute 0.0 0.0 - 0.5 K/uL   Basophils Relative 0 %   Basophils Absolute 0.1 0.0 - 0.1 K/uL   Immature Granulocytes 1 %   Abs Immature Granulocytes 0.07 0.00 - 0.07 K/uL    Comment: Performed at [facility], [address] [place], [place] [address]  Comprehensive metabolic panel     Status: Abnormal   Collection Time: 02/24/20 12:09 PM  Result Value Ref Range   Sodium 136 135 - 145 mmol/L   Potassium 3.7 3.5 - 5.1 mmol/L   Chloride 99 98 - 111 mmol/L   CO2 25 22 - 32 mmol/L   Glucose, Bld 104 (H) 70 - 99 mg/dL    Comment: Glucose reference range applies only to samples taken after fasting for at least 8 hours.   BUN 31 (H) 8 - 23 mg/dL   Creatinine, Ser 2.70 (H) 0.61 - 1.24 mg/dL   Calcium 8.7 (L) 8.9 - 10.3 mg/dL   Total Protein 6.9 6.5 - 8.1 g/dL   Albumin 3.8 3.5 - 5.0 g/dL   AST 39 15 - 41 U/L   ALT 42 0 - 44 U/L   Alkaline Phosphatase 60 38 - 126 U/L   Total Bilirubin 1.6 (H) 0.3 - 1.2 mg/dL   GFR, Estimated 49 (L) >60 mL/min    Comment: (NOTE) Calculated using the CKD-EPI Creatinine Equation (2021)    Anion gap 12 5 - 15    Comment: Performed at [facility], [address] [place], [place] [address]  Urinalysis, Routine w reflex microscopic     Status: Abnormal   Collection Time: 02/24/20 12:09 PM  Result Value Ref Range   Color, Urine YELLOW YELLOW   APPearance CLEAR CLEAR   Specific Gravity, Urine 1.030 1.005 - 1.030   pH 5.0 5.0 - 8.0   Glucose, UA NEGATIVE NEGATIVE mg/dL   Hgb  urine dipstick NEGATIVE NEGATIVE   Bilirubin Urine SMALL (A) NEGATIVE   Ketones, ur 40 (A) NEGATIVE mg/dL   Protein, ur NEGATIVE NEGATIVE mg/dL   Nitrite NEGATIVE NEGATIVE   Leukocytes,Ua NEGATIVE NEGATIVE    Comment: Microscopic not done on urines with negative protein, blood, leukocytes, nitrite, or glucose < 500 mg/dL. Performed at [facility], [address] [place], [place] [address]   Rapid urine drug screen (hospital performed)     Status: None   Collection Time: 02/24/20 12:09 PM  Result Value Ref Range   Opiates NONE DETECTED NONE DETECTED   Cocaine NONE DETECTED NONE DETECTED   Benzodiazepines NONE DETECTED NONE DETECTED   Amphetamines NONE DETECTED NONE DETECTED   Tetrahydrocannabinol NONE DETECTED NONE DETECTED   Barbiturates NONE DETECTED NONE DETECTED    Comment: (NOTE) DRUG SCREEN FOR MEDICAL PURPOSES ONLY.  IF CONFIRMATION IS NEEDED FOR ANY PURPOSE, NOTIFY LAB WITHIN 5 DAYS.  LOWEST DETECTABLE LIMITS FOR URINE DRUG SCREEN Drug Class                     Cutoff (ng/mL) Amphetamine and metabolites    1000 Barbiturate and metabolites    200 Benzodiazepine                 200 Tricyclics and metabolites     300 Opiates and metabolites        300 Cocaine and metabolites        300  THC                            50 Performed at [facility], [address] [place], [place] [address]   SARS Coronavirus 2 by RT PCR (hospital order, performed in [facility] lab) Nasopharyngeal     Status: None   Collection Time: 02/24/20 12:09 PM   Specimen: Nasopharyngeal  Result Value Ref Range   SARS Coronavirus 2 NEGATIVE NEGATIVE    Comment: (NOTE) SARS-CoV-2 target nucleic acids are NOT DETECTED.  The SARS-CoV-2 RNA is generally detectable in upper and lower respiratory specimens during the acute phase of infection. The lowest concentration of SARS-CoV-2 viral copies this assay can detect is 250 copies / mL. A negative result does not  preclude SARS-CoV-2 infection and should not be used as the sole basis for treatment or other patient management decisions.  A negative result may occur with improper specimen collection / handling, submission of specimen other than nasopharyngeal swab, presence of viral mutation(s) within the areas targeted by this assay, and inadequate number of viral copies (<250 copies / mL). A negative result must be combined with clinical observations, patient history, and epidemiological information.  Fact Sheet for Patients:   BoilerBrush.com.cyhttps://www.fda.gov/media/136312/download  Fact Sheet for Healthcare Providers: https://pope.com/https://www.fda.gov/media/136313/download  This test is not yet approved or  cleared by the [place] FDA and has been authorized for detection and/or diagnosis of SARS-CoV-2 by FDA under an Emergency Use Authorization (EUA).  This EUA will remain in effect (meaning this test can be used) for the duration of the COVID-19 declaration under Section 564(b)(1) of the Act, 21 U.S.C. section 360bbb-3(b)(1), unless the authorization is terminated or revoked sooner.  Performed at [facility], [address] [place], [place] [address]     Blood Alcohol level:  No results found for: [facility]  Physical Findings: AIMS:  , ,  ,  ,    CIWA:    COWS:     Musculoskeletal: Strength & Muscle Tone: within normal limits Gait & Station: normal Patient leans: N/A  Psychiatric Specialty Exam: Physical Exam Vitals and nursing note reviewed. Exam conducted with a chaperone present.  Constitutional:      General: He is not in acute distress.    Appearance: Normal appearance. He is not ill-appearing.  HENT:     Head: Normocephalic.  Cardiovascular:     Rate and Rhythm: Normal rate.  Pulmonary:     Effort: Pulmonary effort is normal.  Musculoskeletal:        General: Normal range of motion.     Cervical back: Normal range of motion.  Neurological:     Mental Status: He is alert and  oriented to person, place, and time.  Psychiatric:        Attention and Perception: Attention and perception normal. He does not perceive auditory or visual hallucinations.        Mood and Affect: Mood is anxious.        Speech: Speech normal.        Behavior: Behavior is agitated.        Thought Content: Thought content is not paranoid or delusional. Thought content includes suicidal ideation. Thought content does not include homicidal ideation.        Cognition and Memory: Cognition and memory normal.        Judgment: Judgment is impulsive.     Review of Systems  Unable to perform ROS: Other  Patient wouldn't answer question  Blood pressure (!) 149/78, pulse 85, temperature 98.6 F (37 C), temperature source Oral, resp. rate 16, height 5\' 9"  (1.753 m), weight 83.9 kg, SpO2 93 %.Body mass index is 27.32 kg/m.  General Appearance: Casual  Eye Contact:  None  Speech:  Clear and Coherent and Normal Rate  Volume:  Normal  Mood:  Anxious and Irritable  Affect:  Congruent  Thought Process:  Coherent, Linear and Descriptions of Associations: Circumstantial  Orientation:  Full (Time, Place, and Person)  Thought Content:  WDL  Suicidal Thoughts:  Yes.  with intent/plan  Homicidal Thoughts:  No  Memory:  Immediate;   Fair Recent;   Fair  Judgement:  Fair  Insight:  Fair  Psychomotor Activity:  Normal  Concentration:  Concentration: Fair and Attention Span: Fair  Recall:  FiservFair  Fund of Knowledge:  Fair  Language:  Good  Akathisia:  No  Handed:  Right  AIMS (if indicated):     Assets:  Communication Skills Desire for Improvement Social Support  ADL's:  Intact  Cognition:  WNL  Sleep:      Treatment Plan Summary: Daily contact with patient to assess and evaluate symptoms and progress in treatment, Medication management and Plan Geropsychiatric hospitalization  Encourage to take chest x-ray related to needed if patient is to [facility]  Medication Management: Decrease Zoloft to 75 mg daily Add Seroquel 50 mg Bid  Disposition:  Will continue to seek bed for geropsychiatric hospitalization  Recommend psychiatric Inpatient admission when medically cleared.    This service was provided via telemedicine using a 2-way, interactive audio and video technology.  Names of all persons participating in this telemedicine service and their role in this encounter. Name: Assunta FoundShuvon Huma Imhoff Role: NP  Name: Dr. Nelly RoutArchana Kumar Role: Psychiatrist  Name: Fred Thomas Role: Patient  Name: Fred Thomas  Role: Patients son    Assunta FoundShuvon Grettel Rames, NP 02/25/2020, 10:10 AM

## 2020-02-25 NOTE — ED Provider Notes (Signed)
Emergency Medicine Observation Re-evaluation Note  Fred Thomas is a 85 y.o. male, seen on rounds today.  Pt initially presented to the ED for complaints of Fall Currently, the patient is calm.  Physical Exam  BP (!) 149/78 (BP Location: Right Arm)   Pulse 85   Temp 98.6 F (37 C) (Oral)   Resp 16   Ht 5\' 9"  (1.753 m)   Wt 83.9 kg   SpO2 93%   BMI 27.32 kg/m  Physical Exam General: resting Cardiac: regular Lungs: easily breathing Psych: calm  ED Course / MDM  EKG:EKG Interpretation  Date/Time:  Monday February 24 2020 12:14:56 EST Ventricular Rate:  101 PR Interval:    QRS Duration: 90 QT Interval:  361 QTC Calculation: 468 R Axis:   96 Text Interpretation: Sinus tachycardia Right axis deviation Confirmed by [id number] [phone]) on 02/24/2020 12:39:43 PM  Clinical Course as of 02/25/20 0858  Mon Feb 24, 2020  0155 Patient continues to refuse x-ray and any work-up pain medicine I have offered him.  He now states that he feels like he is going to commit suicide if we do not let him nap.  He states he feels "so bad" that he just might need to commit suicide.  I discussed with him that I am trying to help him and that he is not allowing me to help him.  He has no plan of suicidality.  He states "just send me up to Feb 26, 2020 for suicide."  He reports that he is unhappy being at the days and and that he is not comfortable there. [CH]  0540 Spoke to psychiatry regarding TTS evaluation to restratify.  I feel the patient is likely malingering.  He was seen approximately 6 months ago at behavioral health with similar presentation of suicidality without a plan.  Reportedly poor living conditions in a hotel.  At that time he was accompanied by his son.  Per TTS, reevaluation in the morning. [CH]    Clinical Course User Index [CH] Horton, Redge Gainer, MD   I have reviewed the labs performed to date as well as medications administered while in observation.  Recent changes in the last  24 hours include nothing.  Plan  Current plan is for psych placement. Patient is not under full IVC at this time.   Mayer Masker, DO 02/25/20 [phone]

## 2020-02-25 NOTE — BH Assessment (Signed)
[facility]HH Assessment Progress Note   At 15:38 I spoke to [facility] at [facility].  After reviewing pt's level of functioning with her, she reports that pt is now on their wait list, which she indicates is very short.  If they have discharges tomorrow morning, she anticipates that they will be able to accept this pt.  If so, she will call tomorrow with EMTALA information.  Final disposition is pending as of this writing.  Doylene Canning, [place] Behavioral Health Coordinator [phone]

## 2020-02-26 DIAGNOSIS — R0789 Other chest pain: Secondary | ICD-10-CM | POA: Diagnosis not present

## 2020-02-26 NOTE — ED Provider Notes (Signed)
Emergency Medicine Observation Re-evaluation Note  Fred Thomas is a 85 y.o. male, seen on rounds today.  Pt initially presented to the ED for complaints of Fall Currently, the patient is resting comfortably in bed.  Physical Exam  BP (!) 159/95 (BP Location: Right Arm)   Pulse (!) 104   Temp 98.7 F (37.1 C) (Oral)   Resp 17   Ht 5\' 9"  (1.753 m)   Wt 83.9 kg   SpO2 91%   BMI 27.32 kg/m  Physical Exam General: Nontoxic appearance Cardiac: Mild tachycardia Lungs: Normal respiratory rate Psych: Confused  ED Course / MDM  EKG:EKG Interpretation  Date/Time:  Monday February 24 2020 12:14:56 EST Ventricular Rate:  101 PR Interval:    QRS Duration: 90 QT Interval:  361 QTC Calculation: 468 R Axis:   96 Text Interpretation: Sinus tachycardia Right axis deviation Confirmed by [email or web address] [phone]) on 02/24/2020 12:39:43 PM  Clinical Course as of 02/26/20 1044  Mon Feb 24, 2020  0155 Patient continues to refuse x-ray and any work-up pain medicine I have offered him.  He now states that he feels like he is going to commit suicide if we do not let him nap.  He states he feels "so bad" that he just might need to commit suicide.  I discussed with him that I am trying to help him and that he is not allowing me to help him.  He has no plan of suicidality.  He states "just send me up to Feb 26, 2020 for suicide."  He reports that he is unhappy being at the days and and that he is not comfortable there. [CH]  0540 Spoke to psychiatry regarding TTS evaluation to restratify.  I feel the patient is likely malingering.  He was seen approximately 6 months ago at behavioral health with similar presentation of suicidality without a plan.  Reportedly poor living conditions in a hotel.  At that time he was accompanied by his son.  Per TTS, reevaluation in the morning. [CH]    Clinical Course User Index [CH] Horton, Redge Gainer, MD   I have reviewed the labs performed to date as well as medications  administered while in observation.  Recent changes in the last 24 hours include remained stable while awaiting psychiatric placement.  Plan  Current plan is for psychiatric placement. Patient is not under full IVC at this time.   Mayer Masker, MD 02/26/20 1045

## 2020-02-26 NOTE — Progress Notes (Signed)
CSW faxed information to a Sincere Minor [phone]), representative at a [facility], to review Pt for possible admission.     Jacinta Shoe, LCSW Clinical Social Worker [phone]

## 2020-02-26 NOTE — BH Assessment (Signed)
BHH Assessment Progress Note  At 08:42 this Clinical research associate called [address] and spoke to [facility].  She reports that pt is on their wait list, however, she does not anticipate that they will have beds available on their geriatric unit today.  Doylene Canning, [place] Behavioral Health Coordinator [phone]

## 2020-02-26 NOTE — BH Assessment (Addendum)
B[facility] Assessment Progress Note  Per Nelly Rout, MD, this voluntary pt continues to require psychiatric hospitalization at this time.  The following facilities have been contacted to seek placement for this pt, with results as noted:  On wait list; currently at capacity: [facility] available or anticipated, information sent, decision pending: [facility] (referred 1/24) [facility] (referred 1/25)  Declined: [facility] (declined 1/25 for lack of criteria) [facility]  Under Covid-19 quarantine: [facility] (called 09:45 to update status) [facility] (called 09:47 to update status)  At capacity: [facility]  Unit currently closed: [facility]   Ifthisvoluntaryptis accepted to a facility, please discuss disposition with pt to be sure that heagreesto the plan. If a facility agrees to accept pt and the plan changes in any way please call the facility to inform them of the change. Final disposition is pending as of this writing.  Doylene Canning, [place] Behavioral Health Coordinator [phone]

## 2020-02-26 NOTE — BH Assessment (Signed)
TTS Reassessment:  Per initial assessment: Fred Thomas is an 85 year old patient who was brought to [facility] via EMS after he called 911 due to falling on ice. Per pt notes, pt refused treatment and told his nurses that he just wanted to sleep; when informed that pt could not just sleep and that, if he did not need treatment he would be d/c, he told them he needed to go to [facility] and "I'll just have to say I'm suicidal. Call them and tell them you have a suicidal patient."  When asked why he was brought to the hospital, he stated, "I'm just sick. I have lost control of my life and I am considering suicide *only*. I am here is why I've lost control of my life. I was successful and now I'm a failure." When asked if pt had a plan to kill himself he stated, "I want to go to [facility] and be admitted to the Suicide Risk. I have no money so I'm planning to hurt myself." When asked if pt had attempted to kill himself in the past he stated, "many many times. The last time was when I called 911 I was at the point of death or help."   Upon assessment today, patient states he is "not doing well."  He appears distressed, however most of the focus for reassessment has been his living/housing situation.  He continues to endorse SI, however he feels he may be able to contract for safety if he has a safe place to stay.  He will not contract at this point, however is open to further discussion of options.  He would like his son to be involved in any planning at this point.  Disposition: Patient continues to meet inpatient criteria, given he won't contract for safety until certain conditions are met.

## 2020-02-27 ENCOUNTER — Emergency Department (HOSPITAL_COMMUNITY): Payer: Medicare Other

## 2020-02-27 DIAGNOSIS — R0789 Other chest pain: Secondary | ICD-10-CM | POA: Diagnosis not present

## 2020-02-27 NOTE — ED Notes (Signed)
Pt ambulated w/ walker to shower. Needed minimal assistance with guiding walker. Pt washed himself mostly except for needing help with washing for his back and feet. Stated he cannot reach his feet.

## 2020-02-27 NOTE — ED Notes (Signed)
Pt alert this shift but has been resting this shift. Pt cooperative, no s/s of distress.  Pt able to able and complete ADLs with minimal assist. Pt is HOH.

## 2020-02-27 NOTE — BH Assessment (Signed)
BHH Assessment Progress Note  At 08:14 this Clinical research associate called [address] and spoke to [facility].  She reports that she is about to staff pt with their physician.  Final disposition is pending as of this writing.  Doylene Canning, [place] Behavioral Health Coordinator [phone]

## 2020-02-27 NOTE — ED Provider Notes (Signed)
Emergency Medicine Observation Re-evaluation Note  Fred Thomas is a 85 y.o. male, seen on rounds today.  Pt initially presented to the ED for complaints of Fall Currently, the patient is in no acute distress.  Physical Exam  BP (!) 153/96 (BP Location: Right Arm)    Pulse 88    Temp 98.7 F (37.1 C) (Oral)    Resp 16    Ht 1.753 m (5\' 9" )    Wt 83.9 kg    SpO2 94%    BMI 27.32 kg/m  Physical Exam General: Sleeping in no acute distress   ED Course / MDM  EKG:EKG Interpretation  Date/Time:  Monday February 24 2020 12:14:56 EST Ventricular Rate:  101 PR Interval:    QRS Duration: 90 QT Interval:  361 QTC Calculation: 468 R Axis:   96 Text Interpretation: Sinus tachycardia Right axis deviation Confirmed by 05-09-1976 [phone]) on 02/24/2020 12:39:43 PM  Clinical Course as of 02/27/20 0906  Mon Feb 24, 2020  0155 Patient continues to refuse x-ray and any work-up pain medicine I have offered him.  He now states that he feels like he is going to commit suicide if we do not let him nap.  He states he feels "so bad" that he just might need to commit suicide.  I discussed with him that I am trying to help him and that he is not allowing me to help him.  He has no plan of suicidality.  He states "just send me up to Feb 26, 2020 for suicide."  He reports that he is unhappy being at the days and and that he is not comfortable there. [CH]  0540 Spoke to psychiatry regarding TTS evaluation to restratify.  I feel the patient is likely malingering.  He was seen approximately 6 months ago at behavioral health with similar presentation of suicidality without a plan.  Reportedly poor living conditions in a hotel.  At that time he was accompanied by his son.  Per TTS, reevaluation in the morning. [CH]    Clinical Course User Index [CH] Horton, Redge Gainer, MD   I have reviewed the labs performed to date as well as medications administered while in observation.  Recent changes in the last 24 hours  include still awaiting placement of   Plan  Current plan is for placement. Patient is not under full IVC at this time.   Mayer Masker, MD 02/27/20 [phone]

## 2020-02-27 NOTE — ED Notes (Addendum)
Pt is alert this AM. Pt resting comfortably . Medication compliant this AM. Pt cooperative.  Pt HOH.  Pt can feed self.

## 2020-02-27 NOTE — ED Notes (Signed)
Social Work Statistician contacted r/t pending discharge , pt cleared for discharge by psychiatric services and is ok for discharge by medical services pending recommendations of PT/OT services eval. Hopefully eval

## 2020-02-27 NOTE — BH Assessment (Addendum)
BHH Assessment Progress Note  Per Nelly Rout, MD, this pt continues to require psychiatric hospitalization at this time.  The following facilities have been contacted to seek placement for this pt, with results as noted:  Beds available, information sent, decision pending: [facility] (re-referral)  Declined: [facility] (declined 1/25 for lack of criteria) [facility] (declined 1/26) [facility] (due to abnormal labs)  Unable to reach: [facility] (left message at 15:37) [facility] (left message at 15:32)  Under Covid-19 quarantine: [facility]  At capacity: [facility]  Unit currently closed: [facility]   Ifthisvoluntaryptis accepted to a facility, please discuss disposition with pt to be sure that heagreesto the plan. If a facility agrees to accept pt and the plan changes in any way please call the facility to inform them of the change. Final disposition is pending as of this writing.  Doylene Canning, [place] [facility] Coordinator [phone]

## 2020-02-27 NOTE — ED Provider Notes (Signed)
I was contacted by behavioral health, patient is psychiatrically cleared, safe for outpatient therapy.  I assessed the patient, he feels that he may be a danger to himself still and he has no way to get outpatient resources.  He also complains after fall has had significant difficulty getting around.  Review of chart shows no imaging, CT imaging of the spine will be taken.  He has no neurologic deficit in his lower extremities he has been urinating and having normal bowel function.  We will likely need PT evaluation as this patient is difficulty ambulation he may need skilled nursing facility.  We will need to follow-up on the CT scans and the physical therapy evaluation and likely placement by social work.   Sabino Donovan, MD 02/27/20 2231

## 2020-02-27 NOTE — Consult Note (Signed)
Telepsych Consultation   Reason for Consult: Psychiatry provider reassessment Referring Physician: [facility] emergency department physician Location of Patient: [place] emergency department Location of Provider: Behavioral Health TTS Department  Patient Identification: Fred Thomas MRN:  [id number] Principal Diagnosis: Acute adjustment disorder with mixed anxiety and depressed mood Diagnosis:  Principal Problem:   Acute adjustment disorder with mixed anxiety and depressed mood Active Problems:   Self-care deficit in patient living alone   Suicidal ideations   Total Time spent with patient: 45 minutes  Subjective:   Fred Thomas is a 85 y.o. male patient.  Patient states "I fell on my back on the ice on January 18 and I do not have the capability to care for myself any longer."  HPI:    Patient reports recent stressors include he no longer receives his Social Security retirement benefits, benefits stopped in May 2021.  Patient reports he has resided in a local hotel for 7 years but feels that he is no longer able to care for himself physically.  Patient reports he has not been taking any medications for approximately the last 4 years.  Patient reports he is not currently followed by outpatient primary care provider.  Patient reports he has been followed by outpatient psychiatry in the distant past but currently has no outpatient psychiatric provider.  Patient assessed by nurse practitioner.  Patient alert and oriented, answers appropriately.  Patient noted to be hard of hearing but using hearing aid.  Patient pleasant and cooperative with assessment.  Patient denies suicidal ideations.  Patient endorses 1 prior suicide attempt approximately 1 year ago.  Patient denies self-harm behaviors.  Patient denies homicidal ideations.  There is no evidence of delusional thought content and no indication that patient is responding to internal stimuli.  Patient denies symptoms of  paranoia.  Patient currently resides in [place] in a hotel.  Patient denies access to weapons.  Patient is retired.  Patient denies alcohol and substance use.  Patient reports history of alcohol use, last use in 1984.  Patient endorses average sleep and appetite.  Patient offered support and encouragement.  Past Psychiatric History: Depression, suicidal ideation Risk to Self:  Denies Risk to Others:  Denies Prior Inpatient Therapy:  None reported Prior Outpatient Therapy:  Many years ago, patient unable to recall name of provider  Past Medical History:  Past Medical History:  Diagnosis Date  . Anemia   . Blood transfusion   . Depression   . Hyperlipidemia   . Hypertension     Past Surgical History:  Procedure Laterality Date  . TONSILLECTOMY AND ADENOIDECTOMY     Family History: History reviewed. No pertinent family history. Family Psychiatric  History: None reported Social History:  Social History   Substance and Sexual Activity  Alcohol Use No     Social History   Substance and Sexual Activity  Drug Use No    Social History   Socioeconomic History  . Marital status: Divorced    Spouse name: Not on file  . Number of children: Not on file  . Years of education: Not on file  . Highest education level: Not on file  Occupational History  . Not on file  Tobacco Use  . Smoking status: Former Smoker    Years: 10.00    Types: Pipe  . Smokeless tobacco: Never Used  Substance and Sexual Activity  . Alcohol use: No  . Drug use: No  . Sexual activity: Not on file  Other Topics  Concern  . Not on file  Social History Narrative  . Not on file   Social Determinants of Health   Financial Resource Strain: Not on file  Food Insecurity: Not on file  Transportation Needs: Not on file  Physical Activity: Not on file  Stress: Not on file  Social Connections: Not on file   Additional Social History:    Allergies:  No Known Allergies  Labs: No results found for  this or any previous visit (from the past 48 hour(s)).  Medications:  Current Facility-Administered Medications  Medication Dose Route Frequency Provider Last Rate Last Admin  . QUEtiapine (SEROQUEL) tablet 50 mg  50 mg Oral BID Rankin, Shuvon B, NP   50 mg at 02/27/20 0956  . venlafaxine XR (EFFEXOR-XR) 24 hr capsule 75 mg  75 mg Oral Daily Rankin, Shuvon B, NP   75 mg at 02/27/20 4854   Current Outpatient Medications  Medication Sig Dispense Refill  . aspirin EC 81 MG tablet Take 81 mg by mouth daily.    Marland Kitchen HYDROcodone-acetaminophen (NORCO) 5-325 MG tablet Take 0.5-1 tablets by mouth every 4 (four) hours as needed (for pain; may cause constipation). 10 tablet 0  . penicillin v potassium (VEETID) 500 MG tablet Take 1 tablet (500 mg total) by mouth 4 (four) times daily. 28 tablet 0  . pravastatin (PRAVACHOL) 20 MG tablet Take 20 mg by mouth daily.    Marland Kitchen venlafaxine XR (EFFEXOR-XR) 150 MG 24 hr capsule Take 150 mg by mouth daily.      Musculoskeletal: Strength & Muscle Tone: decreased Gait & Station: unsteady Patient leans: N/A  Psychiatric Specialty Exam: Physical Exam Vitals and nursing note reviewed.  Constitutional:      Appearance: He is well-developed.  HENT:     Head: Normocephalic.  Cardiovascular:     Rate and Rhythm: Normal rate.  Pulmonary:     Effort: Pulmonary effort is normal.  Neurological:     Mental Status: He is alert and oriented to person, place, and time.  Psychiatric:        Attention and Perception: Attention and perception normal.        Mood and Affect: Affect normal. Mood is depressed.        Speech: Speech normal.        Behavior: Behavior normal. Behavior is cooperative.        Thought Content: Thought content normal.        Cognition and Memory: Cognition and memory normal.        Judgment: Judgment normal.     Review of Systems  Constitutional: Negative.   HENT: Negative.   Eyes: Negative.   Respiratory: Negative.   Cardiovascular:  Negative.   Gastrointestinal: Negative.   Genitourinary: Negative.   Musculoskeletal: Negative.   Skin: Negative.   Neurological: Negative.   Psychiatric/Behavioral: Negative.     Blood pressure 125/85, pulse 95, temperature (!) 97.4 F (36.3 C), temperature source Oral, resp. rate 20, height 5\' 9"  (1.753 m), weight 83.9 kg, SpO2 92 %.Body mass index is 27.32 kg/m.  General Appearance: Casual  Eye Contact:  Fair  Speech:  Clear and Coherent and Normal Rate  Volume:  Normal  Mood:  Depressed  Affect:  Congruent  Thought Process:  Coherent, Goal Directed and Descriptions of Associations: Intact  Orientation:  Full (Time, Place, and Person)  Thought Content:  Logical  Suicidal Thoughts:  No  Homicidal Thoughts:  No  Memory:  Immediate;   Fair Recent;  Fair Remote;   Fair  Judgement:  Fair  Insight:  Fair  Psychomotor Activity:  Normal  Concentration:  Concentration: Good and Attention Span: Good  Recall:  Good  Fund of Knowledge:  Good  Language:  Good  Akathisia:  No  Handed:  Right  AIMS (if indicated):     Assets:  Communication Skills Desire for Improvement Leisure Time Resilience Social Support  ADL's:  Impaired  Cognition:  WNL  Sleep:        Treatment Plan Summary: Plan Patient cleared by psychiatry.  Patient reviewed with Dr. Bronwen Betters Social work consult initiated to address patient concerns. Follow-up with outpatient psychiatry. Discontinue Seroquel 50 mg.  Current medications: -Venlafaxine XR 75 mg daily  Disposition: No evidence of imminent risk to self or others at present.   Patient does not meet criteria for psychiatric inpatient admission. Supportive therapy provided about ongoing stressors. Discussed crisis plan, support from social network, calling 911, coming to the Emergency Department, and calling Suicide Hotline.  This service was provided via telemedicine using a 2-way, interactive audio and video technology.  Names of all persons  participating in this telemedicine service and their role in this encounter. Name: Suzy Bouchard Role: Patient  Name: Berneice Heinrich Role: FNP  Name: Dr. Bronwen Betters Role: Psychiatrist    Patrcia Dolly, FNP 02/27/2020 5:01 PM

## 2020-02-27 NOTE — Clinical Social Work Note (Signed)
CSW updated by Reita Cliche, RN that pt has been psychiatrically cleared and will be evaluated by PT in the morning. CSW left handoff with update for first shift social worker who will cover in AM. TOC to follow for possible d/c needs.

## 2020-02-27 NOTE — ED Notes (Signed)
Pt encouraged to take shower.   Pt was assisted to shower. Pt was assisted with shower Pt ambulated with walker and stand by assist.

## 2020-02-28 DIAGNOSIS — R0789 Other chest pain: Secondary | ICD-10-CM | POA: Diagnosis not present

## 2020-02-28 NOTE — Progress Notes (Addendum)
533 pm Attempted PASRR, Level II PASRR needed. Faxed progress notes to [facility]. Waiting PT evaluation. Isidoro Donning RN CCM, [facility] ED TOC CM [phone]  646 pm Faxed FL2 and PT notes to [facility] RN CCM, [facility] ED TOC CM [phone]

## 2020-02-28 NOTE — Progress Notes (Addendum)
TOC CM referral reviewed, pt was psych cleared and may need SNF placement. Contacted ED attending for PT eval. Contacted PT to see if they can see pt today.  Isidoro Donning RN CCM, [facility] ED TOC CM [phone]

## 2020-02-28 NOTE — ED Notes (Addendum)
Pt's son, Koty Anctil, [phone], would like to speak with a provider.  His son said that his father is a hard case. He feels that his father will kill himself if released.  He does not feel that his father is of sound mind.  He is not his father's guardian or POA.   Ricquita LCSW informed through messaging of the above.

## 2020-02-28 NOTE — [place] FL2 (Signed)
  [place] MEDICAID FL2 LEVEL OF CARE SCREENING TOOL     IDENTIFICATION  Patient Name: Fred Thomas Birthdate: 07-10-1935 Sex: male Admission Date (Current Location): 02/23/2020  [facility] and [place] Number:  Producer, television/film/video and Address:         Provider Number: [id number]  Attending Physician Name and Address:  Default, Provider, MD  Relative Name and Phone Number:  Trayven Lumadue #[phone]    Current Level of Care: Hospital Recommended Level of Care: Skilled Nursing Facility Prior Approval Number:    Date Approved/Denied:   PASRR Number:    Discharge Plan: SNF    Current Diagnoses: Patient Active Problem List   Diagnosis Date Noted  . Self-care deficit in patient living alone 02/24/2020  . Suicidal ideations 02/24/2020  . Acute adjustment disorder with mixed anxiety and depressed mood 02/24/2020  . Syncope and collapse 06/24/2011  . Acute kidney injury (HCC) 06/24/2011  . Leukocytosis 06/24/2011  . Hypertension 06/24/2011  . Hyperlipidemia 06/24/2011  . Depression 06/24/2011    Orientation RESPIRATION BLADDER Height & Weight     Self,Time,Situation,Place  Normal Continent Weight: 83.9 kg Height:  5\' 9"  (175.3 cm)  BEHAVIORAL SYMPTOMS/MOOD NEUROLOGICAL BOWEL NUTRITION STATUS      Continent Diet (Regular)  AMBULATORY STATUS COMMUNICATION OF NEEDS Skin   Limited Assist Verbally Normal                       Personal Care Assistance Level of Assistance  Bathing,Feeding,Total care,Dressing Bathing Assistance: Limited assistance Feeding assistance: Independent Dressing Assistance: Limited assistance Total Care Assistance: Limited assistance   Functional Limitations Info  Sight,Hearing,Speech Sight Info: Adequate Hearing Info: Impaired Speech Info: Adequate    SPECIAL CARE FACTORS FREQUENCY  PT (By licensed PT),OT (By licensed OT)     PT Frequency: 5x per week OT Frequency: 5x per week            Contractures Contractures  Info: Not present    Additional Factors Info  Code Status,Allergies Code Status Info: Full Code Allergies Info: No Known Allergies           Current Medications (02/28/2020):  This is the current hospital active medication list Current Facility-Administered Medications  Medication Dose Route Frequency Provider Last Rate Last Admin  . venlafaxine XR (EFFEXOR-XR) 24 hr capsule 75 mg  75 mg Oral Daily Rankin, Shuvon B, NP   75 mg at 02/28/20 03/01/20   Current Outpatient Medications  Medication Sig Dispense Refill  . aspirin EC 81 MG tablet Take 81 mg by mouth daily.    7564 HYDROcodone-acetaminophen (NORCO) 5-325 MG tablet Take 0.5-1 tablets by mouth every 4 (four) hours as needed (for pain; may cause constipation). 10 tablet 0  . penicillin v potassium (VEETID) 500 MG tablet Take 1 tablet (500 mg total) by mouth 4 (four) times daily. 28 tablet 0  . pravastatin (PRAVACHOL) 20 MG tablet Take 20 mg by mouth daily.    Marland Kitchen venlafaxine XR (EFFEXOR-XR) 150 MG 24 hr capsule Take 150 mg by mouth daily.       Discharge Medications: Please see discharge summary for a list of discharge medications.  Relevant Imaging Results:  Relevant Lab Results:   Additional Information #[id number]  06-19-1985, RN

## 2020-02-28 NOTE — ED Provider Notes (Signed)
Emergency Medicine Observation Re-evaluation Note  Fred Thomas is a 85 y.o. male, seen on rounds today.  Pt initially presented to the ED for complaints of Fall Currently, the patient is awaiting placement.  Physical Exam  BP (!) 133/91 (BP Location: Right Arm)   Pulse 84   Temp 98.6 F (37 C) (Oral)   Resp 18   Ht 1.753 m (5\' 9" )   Wt 83.9 kg   SpO2 96%   BMI 27.32 kg/m  Physical Exam General: Resting Cardiac: Regular rate Lungs: Breathing easily Psych: Resting, calm  ED Course / MDM  EKG:EKG Interpretation  Date/Time:  Monday February 24 2020 12:14:56 EST Ventricular Rate:  101 PR Interval:    QRS Duration: 90 QT Interval:  361 QTC Calculation: 468 R Axis:   96 Text Interpretation: Sinus tachycardia Right axis deviation Confirmed by 05-09-1978 [phone]) on 02/24/2020 12:39:43 PM  I have reviewed the labs performed to date as well as medications administered while in observation.  Recent changes in the last 24 hours include.  Patient had a CT of the thoracic and lumbar spine yesterday.  Likely meningioma noted.  No acute fractures noted.  Foraminal stenosis noted.  Patient was assessed by psychiatry and cleared.  Does not require psychiatric admission.  Provider last night felt that patient will require possible skilled nursing facility  Plan  Current plan is for attempts at possible placement.  Patient is awaiting TOC eval.    02/26/2020, MD 02/28/20 [phone]

## 2020-02-28 NOTE — Evaluation (Signed)
Physical Therapy Evaluation Patient Details Name: Fred Thomas MRN: [id number] DOB: 05-07-1935 Today's Date: 02/28/2020   History of Present Illness  Pt is 85 yo male with history of hypertension, hyperlipidemia who presents following a fall.  Patient reports that he fell 5 days ago 1/18 after he slipped on some ice.  All imaging negative.  Pt later with reports of suicidial ideations, but has now been cleared by psych.  Clinical Impression  Pt admitted with above diagnosis. Pt was able to transfer and ambulate with min A.  He does have back pain upper R back - observed mild ecchymosis, edema, and muscle tightness.  Educated on log roll technique. He has hx of falls and was living alone in a hotel.  Due to fall risk and decreased support - recommend SNF at d/c.  Pt currently with functional limitations due to the deficits listed below (see PT Problem List). Pt will benefit from skilled PT to increase their independence and safety with mobility to allow discharge to the venue listed below.       Follow Up Recommendations SNF    Equipment Recommendations  Rolling walker with 5" wheels    Recommendations for Other Services       Precautions / Restrictions Precautions Precautions: Fall      Mobility  Bed Mobility Overal bed mobility: Needs Assistance Bed Mobility: Rolling;Sidelying to Sit;Sit to Sidelying Rolling: Min guard Sidelying to sit: Min guard     Sit to sidelying: Min guard General bed mobility comments: Educated on log roll technique and provided tactile cues    Transfers Overall transfer level: Needs assistance Equipment used: Rolling walker (2 wheeled) Transfers: Sit to/from Stand Sit to Stand: Min assist         General transfer comment: Min A to steady with cues for safe hand placement  Ambulation/Gait Ambulation/Gait assistance: Min assist Gait Distance (Feet): 60 Feet Assistive device: Rolling walker (2 wheeled) Gait Pattern/deviations:  Step-through pattern;Decreased stride length     General Gait Details: min A to steady with multiple cues for safe RW proximity  Stairs            Wheelchair Mobility    Modified Rankin (Stroke Patients Only)       Balance Overall balance assessment: Needs assistance Sitting-balance support: No upper extremity supported Sitting balance-Leahy Scale: Good     Standing balance support: Bilateral upper extremity supported;No upper extremity supported Standing balance-Leahy Scale: Fair Standing balance comment: Hx of falls, RW for ambulation, static stand no UE                             Pertinent Vitals/Pain Pain Assessment: Faces Faces Pain Scale: Hurts little more Pain Location: R back around T6-8 level Pain Descriptors / Indicators: Discomfort;Sore Pain Intervention(s): Limited activity within patient's tolerance;Monitored during session (taught log roll)    Home Living Family/patient expects to be discharged to:: Unsure Living Arrangements: Other (Comment) (Pt was living alone at [facility])             Home Equipment: None      Prior Function Level of Independence: Independent               Hand Dominance        Extremity/Trunk Assessment   Upper Extremity Assessment Upper Extremity Assessment: Overall WFL for tasks assessed    Lower Extremity Assessment Lower Extremity Assessment: Overall WFL for tasks assessed    Cervical /  Trunk Assessment Cervical / Trunk Assessment: Other exceptions Cervical / Trunk Exceptions: Noted area of edema, mild ecchymosis, muscle tightness with palpation in R mid-upper back between spine and scapula - pt reports this is area of pain  Communication   Communication: HOH  Cognition Arousal/Alertness: Awake/alert Behavior During Therapy: WFL for tasks assessed/performed Overall Cognitive Status: No family/caregiver present to determine baseline cognitive functioning Area of Impairment: Problem  solving;Awareness;Safety/judgement                         Safety/Judgement: Decreased awareness of safety Awareness: Emergent Problem Solving: Difficulty sequencing;Requires verbal cues;Requires tactile cues;Slow processing        General Comments General comments (skin integrity, edema, etc.): VSS    Exercises     Assessment/Plan    PT Assessment Patient needs continued PT services  PT Problem List Decreased strength;Decreased mobility;Decreased safety awareness;Decreased activity tolerance;Decreased balance;Decreased knowledge of use of DME;Decreased knowledge of precautions;Decreased cognition       PT Treatment Interventions DME instruction;Therapeutic activities;Gait training;Therapeutic exercise;Patient/family education;Stair training;Balance training;Functional mobility training    PT Goals (Current goals can be found in the Care Plan section)  Acute Rehab PT Goals Patient Stated Goal: improve back pain PT Goal Formulation: With patient Time For Goal Achievement: 03/13/20 Potential to Achieve Goals: Good    Frequency Min 2X/week   Barriers to discharge Decreased caregiver support      Co-evaluation               AM-PAC PT "6 Clicks" Mobility  Outcome Measure Help needed turning from your back to your side while in a flat bed without using bedrails?: A Little Help needed moving from lying on your back to sitting on the side of a flat bed without using bedrails?: A Little Help needed moving to and from a bed to a chair (including a wheelchair)?: A Little Help needed standing up from a chair using your arms (e.g., wheelchair or bedside chair)?: A Little Help needed to walk in hospital room?: A Little Help needed climbing 3-5 steps with a railing? : A Little 6 Click Score: 18    End of Session   Activity Tolerance: Patient tolerated treatment well Patient left: in bed;with call bell/phone within reach;with bed alarm set Nurse Communication:  Mobility status (recommendation for log roll; could do hot back back if needed) PT Visit Diagnosis: Unsteadiness on feet (R26.81);History of falling (Z91.81)    Time: 1735-6701 PT Time Calculation (min) (ACUTE ONLY): 20 min   Charges:   PT Evaluation $PT Eval Low Complexity: 1 Low          Lenola Lockner, PT Acute Rehab Services Pager [phone] [facility] [phone]    Rayetta Humphrey 02/28/2020, 6:19 PM

## 2020-02-29 NOTE — ED Provider Notes (Signed)
Emergency Medicine Observation Re-evaluation Note  Fred Thomas is a 85 y.o. male, seen on rounds today.  Pt initially presented to the ED for complaints of Fall Currently, the patient is waiting for placement, cleared by psychiatry.  Physical Exam  BP (!) 146/92 (BP Location: Right Arm)   Pulse 97   Temp 98.5 F (36.9 C) (Oral)   Resp 18   Ht 1.753 m (5\' 9" )   Wt 83.9 kg   SpO2 96%   BMI 27.32 kg/m  Physical Exam General: Calm cooperative Cardiac: Regular rate Lungs: Breathing easily Psych: Normal mood, calm  ED Course / MDM  EKG:EKG Interpretation  Date/Time:  Monday February 24 2020 12:14:56 EST Ventricular Rate:  101 PR Interval:    QRS Duration: 90 QT Interval:  361 QTC Calculation: 468 R Axis:   96 Text Interpretation: Sinus tachycardia Right axis deviation Confirmed by 05-09-1978 [phone]) on 02/24/2020 12:39:43 PM   I have reviewed the labs performed to date as well as medications administered while in observation.  Recent changes in the last 24 hours include assessment by PT.  They recommended SNF placement.  Plan  Current plan is for skilled nursing facility placement.    02/26/2020, MD 02/29/20 [phone]

## 2020-03-01 NOTE — ED Provider Notes (Signed)
Emergency Medicine Observation Re-evaluation Note  Fred Thomas is a 85 y.o. male, seen on rounds today.  Pt initially presented to the ED for complaints of Fall Currently, the patient is waiting for placement, patient was cleared by psychiatry.  Physical Exam  BP (!) 138/94 (BP Location: Right Arm)   Pulse (!) 105   Temp 98.4 F (36.9 C) (Oral)   Resp 18   Ht 1.753 m (5\' 9" )   Wt 83.9 kg   SpO2 95%   BMI 27.32 kg/m  Physical Exam General: Calm, sleeping Cardiac: Regular rate Lungs: Breathing easily Psych: Calm, no agitation  ED Course / MDM  EKG:EKG Interpretation  Date/Time:  Monday February 24 2020 12:14:56 EST Ventricular Rate:  101 PR Interval:    QRS Duration: 90 QT Interval:  361 QTC Calculation: 468 R Axis:   96 Text Interpretation: Sinus tachycardia Right axis deviation Confirmed by 05-09-1978 [phone]) on 02/24/2020 12:39:43 PM   I have reviewed the labs performed to date as well as medications administered while in observation.  Recent changes in the last 24 hours include no acute changes, patient was calm and cooperative all night  Plan  Current plan is for TOC placement.  Patient is still waiting for PT evaluation.    02/26/2020, MD 03/01/20 [phone]

## 2020-03-01 NOTE — ED Notes (Signed)
Patient awake alert oriented and calm.

## 2020-03-01 NOTE — ED Notes (Signed)
Dinner tray given

## 2020-03-01 NOTE — ED Notes (Signed)
Patient calm and cooperative throughout the shift.

## 2020-03-02 DIAGNOSIS — R0789 Other chest pain: Secondary | ICD-10-CM | POA: Diagnosis not present

## 2020-03-02 NOTE — ED Provider Notes (Addendum)
Emergency Medicine Observation Re-evaluation Note  Fred Thomas is a 85 y.o. male, seen on rounds today.  Pt initially presented to the ED for complaints of Fall Currently, the patient is awaiting placement after being cleared by psychiatry team.  Physical Exam  BP 140/88   Pulse 84   Temp 98.2 F (36.8 C)   Resp 17   Ht 5\' 9"  (1.753 m)   Wt 83.9 kg   SpO2 98%   BMI 27.32 kg/m  Physical Exam General: Awake, towel over head, NAD HEENT: atraumatic, normocephalic Pulm: normal work of breathing Psych: calm, mildly anxious but polite and cooperative  ED Course / MDM  EKG:EKG Interpretation  Date/Time:  Monday February 24 2020 12:14:56 EST Ventricular Rate:  101 PR Interval:    QRS Duration: 90 QT Interval:  361 QTC Calculation: 468 R Axis:   96 Text Interpretation: Sinus tachycardia Right axis deviation Confirmed by 05-09-1978 [phone]) on 02/24/2020 12:39:43 PM   I have reviewed the labs performed to date as well as medications administered while in observation.  Recent changes in the last 24 hours include no acute events overnight, no agitation. PT evaluated--recommended SNF.  Plan  Current plan is for SW placement.  Meia Emley, 02/26/2020, MD 03/02/20 1049    Creedence Kunesh, 03/04/20, MD 03/02/20 1050

## 2020-03-02 NOTE — ED Notes (Signed)
Pt alert this shift. Pt resting comfortably this shift. Pt calm, cooperative, no s/s of distress at this time. Medication compliant this shift. Pt able to feed self . Pt able to ambulate with walker and stand by assist. Pt denied SI at this time. Pt is wanting placement for safety.

## 2020-03-02 NOTE — Progress Notes (Signed)
TOC CM/CSW faxed out pts information to the following SNF's for possible placement.  [facility] [place] [facility]  [facility] Preferred SNF  [facility] Preferred SNF  [facility]  [facility] Preferred SNF  [facility] Preferred SNF [facility] [place] [facility]  [facility] [place] Preferred SNF  [facility] [place] [facility] [place] Preferred SNF  [facility] [facility] [place] [facility] [place] Preferred SNF   [facility] Preferred SNF  [facility] Preferred SNF  [facility] Preferred SNF  [facility] [place] [facility]F  [facility] SN[facility]/ALF   [facility] Details [facility] [place] [facility]  [facility] Preferred SNF [facility] Preferred SNF [facility] Preferred SNF HUB-[facility] [place] Preferred SNF H[facility] SNF  [facility] [place] [facility]  [facility] [facility]  [facility] [facility] Preferred SNF [facility] [place] Preferred SNF  [facility] Preferred SNF [facility] [place] PREFERRED SNF/ALF [facility]  [facility] [place] [facility]/ALF [facility]  [name]  [facility]   [facility] PREFERRED SNF  [facility] Preferred SNF  [facility] SN[facility]/ALF [facility]  [facility] [facility] [place] Preferred SNF H[facility] Preferred SNF  [facility] [place] [facility]  [facility] Preferred SNF [facility] Preferred SNF [facility]  HUB-[facility]  Preferred SNF [facility] Preferred SNF  [facility] [place] SNF  [facility] [place] Preferred SNF  [facility] [place] SNF  HUB-[facility] Preferred SNF   [facility]-[place] [facility] AT [place] SNF HUB-[facility] [place] Preferred SNF   [facility] Preferred SNF  [facility] Preferred SNF  HUB-[facility] Preferred SNF  [facility] [place] SNF/ALF  [facility] SNF/ALF  [facility] SN[facility]  [facility] [place] SN[facility]  [facility] Preferred SNF  [facility] Preferred SNF Details [facility] Preferred SNF  HUB-[facility]  Preferred SNF HUB-[facility] [place] SNF Details [facility] [place] SNF H[facility]-[place] SNF Details [facility] [place] [facility] Preferred SNF [facility] [place] Preferred SNF  [facility] Preferred SNF  [facility] [place] PREFERRED SNF/ALF [facility] [place] SNF/ALF  [name]  [facility] H[facility]-[place] PREFERRED SNF  [facility] Preferred SNF  [facility] SNF/ALF [facility] [facility] SNF H[facility]-[place]  Preferred SNF HUB-[facility] Preferred SNF  [facility] SN[facility]  CSW will continue to follow for dc needs.  Naureen Benton Tarpley-Carter, MSW, LCSW-A Pronouns:  She, Her, Hers                  [name] ED Transitions of CareClinical Social Worker [email or web address] [phone]

## 2020-03-02 NOTE — Progress Notes (Signed)
TOC CM spoke to pt's son, Byren Pankow. States he does want pt to receive rehab at [facility]. Explained pt may need dc back to hotel if unable to locate facility that will accept for SNF. [facility] did not accept referral for SNF.Isidoro Donning RN CCM, [facility] ED TOC CM [phone]

## 2020-03-02 NOTE — TOC Progression Note (Signed)
Per Ronald Lobo at [facility] they cannot accept pt.

## 2020-03-03 DIAGNOSIS — R0789 Other chest pain: Secondary | ICD-10-CM | POA: Diagnosis not present

## 2020-03-03 NOTE — Progress Notes (Signed)
TOC CM/CSW spoke with pts son/Fred Thomas [phone].  Fred Thomas.    CSW will continue to follow for dc needs.  [name], MSW, LCSW-A Pronouns:  She, Her, Hers                  [name] ED Transitions of CareClinical Social Worker [email or web address] [phone]

## 2020-03-03 NOTE — Progress Notes (Signed)
PT Cancellation Note  Patient Details Name: Fred Thomas MRN: [id number] DOB: [birth year]   Cancelled Treatment:    Reason Eval/Treat Not Completed:  Attempted PT eval-pt politely declined participation at this time. He reported feeling tired and he was trying to get some rest. Will check back another day.    Faye Ramsay, PT Acute Rehabilitation  Office: [phone] Pager: [phone]

## 2020-03-03 NOTE — Progress Notes (Signed)
TOC CM/CSW pt is being reevaluated by TTS.  CSW will continue to follow for dc needs.  Aymee Fomby Tarpley-Carter, MSW, LCSW-A Pronouns:  She, Her, Hers                  [name] ED Transitions of CareClinical Social Worker [email or web address] [phone]

## 2020-03-03 NOTE — ED Notes (Signed)
Patient is calm and cooperative with staff.

## 2020-03-03 NOTE — ED Notes (Signed)
Case manager in room to make patient aware that PT will come see patient and she is going try to arrange for him to go back to motel today.

## 2020-03-04 DIAGNOSIS — R0789 Other chest pain: Secondary | ICD-10-CM | POA: Diagnosis not present

## 2020-03-04 NOTE — ED Provider Notes (Signed)
Emergency Medicine Observation Re-evaluation Note  Fred Thomas is a 85 y.o. male, seen on rounds today.  Pt initially presented to the ED for complaints of Fall Currently, the patient is resting in bed.  Physical Exam  BP 134/85 (BP Location: Right Arm)   Pulse 91   Temp 98.1 F (36.7 C) (Oral)   Resp 16   Ht 5\' 9"  (1.753 m)   Wt 83.9 kg   SpO2 95%   BMI 27.32 kg/m  Physical Exam General: resting Cardiac: warm and well perfused Lungs: even and unlabored Psych: calm  ED Course / MDM  EKG:EKG Interpretation  Date/Time:  Monday February 24 2020 12:14:56 EST Ventricular Rate:  101 PR Interval:    QRS Duration: 90 QT Interval:  361 QTC Calculation: 468 R Axis:   96 Text Interpretation: Sinus tachycardia Right axis deviation Confirmed by 05-09-1978 [phone]) on 02/24/2020 12:39:43 PM  Clinical Course as of 03/04/20 0931  Mon Feb 24, 2020  0155 Patient continues to refuse x-ray and any work-up pain medicine I have offered him.  He now states that he feels like he is going to commit suicide if we do not let him nap.  He states he feels "so bad" that he just might need to commit suicide.  I discussed with him that I am trying to help him and that he is not allowing me to help him.  He has no plan of suicidality.  He states "just send me up to Feb 26, 2020 for suicide."  He reports that he is unhappy being at the days and and that he is not comfortable there. [CH]  0540 Spoke to psychiatry regarding TTS evaluation to restratify.  I feel the patient is likely malingering.  He was seen approximately 6 months ago at behavioral health with similar presentation of suicidality without a plan.  Reportedly poor living conditions in a hotel.  At that time he was accompanied by his son.  Per TTS, reevaluation in the morning. [CH]    Clinical Course User Index [CH] Horton, Redge Gainer, MD   I have reviewed the labs performed to date as well as medications administered while in observation.   Recent changes in the last 24 hours include patient declined PT, SW working on placement but per one note yesterday, may send back to motel.  Plan  Current plan is for disposition per CM/SW    Mayer Masker, MD 03/04/20 [phone]

## 2020-03-04 NOTE — Progress Notes (Addendum)
TOC CM/CSW received communication from pts son/Jejuan.  Nicolaus stated that he contact [facility] and they are currently accepting psych pts.  Kristy has currently applied for Medicaid for the pt.  But he would be willing to pay costs of pocket until pts Medicaid is approved.  CSW will continue to follow for dc needs.  Ithzel Fedorchak Tarpley-Carter, MSW, LCSW-A Pronouns:  She, Her, Hers                  [name] ED Transitions of CareClinical Social Worker [email or web address] [phone]

## 2020-03-04 NOTE — Progress Notes (Signed)
TOC CM/CSW received information from [facility] in regards to a follow-up with [facility] Care/Fred Thomas [phone], Fax [phone].  CSW reached out to [place].  Chyrel Masson stated that she does currently have a male bed opening.  Fred asked that paperwork be faxed to her to review.  CSW will continue to follow for dc needs.  Fred Thomas, MSW, LCSW-A Pronouns:  She, Her, Hers                  [name] ED Transitions of CareClinical Social Worker [email or web address] [phone]

## 2020-03-05 DIAGNOSIS — R0789 Other chest pain: Secondary | ICD-10-CM | POA: Diagnosis not present

## 2020-03-05 NOTE — Progress Notes (Signed)
TOC CM/CSW met with [facility] [phone] and pt, so Cierra could assess pt.  Valinda Party stated she would notify me of pts acceptance no later than 5pm today.  Pt is still being reviewed by [facility]  CSW will continue to follow for dc needs.Isbella Arline Tarpley-Carter, MSW, LCSW-A Pronouns:  She, Her, [place] ED Transitions of CareClinical Social Worker [email or web address] [phone]

## 2020-03-05 NOTE — ED Provider Notes (Addendum)
7:58 PM I was informed that patient has been cleared from the social work standpoint to be discharged and go back to a hotel.  Transport was arranged.  I reviewed the chart and it appears he has been psychiatric cleared, cleared, and now cleared from a social work standpoint.  I went assessed the patient and his breath sounds were clear and he had no chest tenderness.  Patient is resting comfortably and denies new complaints.  He agrees with discharge.  Patient will be discharged and will follow up with a PCP.  Clinical Impression: 1. Fall, initial encounter   2. Malingering     Disposition: Discharge  Condition: Good  I have discussed the results, Dx and Tx plan with the pt(& family if present). He/she/they expressed understanding and agree(s) with the plan. Discharge instructions discussed at great length. Strict return precautions discussed and pt &/or family have verbalized understanding of the instructions. No further questions at time of discharge.    New Prescriptions   No medications on file    Follow Up: [facility] [address] [place] [address] [phone] Schedule an appointment as soon as possible for a visit    [facility] [address] [id number] mc [place] [address] [phone]         Bryannah Boston, Canary Brim, MD 03/05/20 2000    8:30 PM Was just told from nursing that patient was going to be discharged back to the hotel by [facility] and they could not take him to get a new key made.  Thus, safe transport only to take the patient but it is not available at this time.  Patient will remain pending discharge overnight until safe transport and get him in the morning.     Akeya Ryther, Canary Brim, MD 03/05/20 2030

## 2020-03-05 NOTE — ED Notes (Signed)
Received call from SW that patient is being discharged back to [facility]. MD will complete discharge paperwork. Once completed patient will be taken out to ED entrance to be transported to [facility]

## 2020-03-05 NOTE — ED Notes (Signed)
Called patient's son Waymire) to inform him that patient would not transport tonight.  He was very Adult nurse.

## 2020-03-05 NOTE — Progress Notes (Signed)
TOC CM/CSW spoke with pts son/[name] [phone] to update him on Fred Thomas coming to visit pt for prospective placement.  CSW stated to [name] that she would keep him updated.  CSW will continue to follow for dc needs.  [name], MSW, LCSW-A Pronouns:  She, Her, Hers                  [name] ED Transitions of CareClinical Social Worker [email or web address] [phone]

## 2020-03-05 NOTE — ED Notes (Signed)
When PT was here to work with pt he walked down the hall with his walker without c/o pain at all.

## 2020-03-05 NOTE — Progress Notes (Signed)
TOC CM/CSW verified with pts son/[name] that pt he would continue care for pt and is in control of pts finances.  [name] will assume costs for hotel room.  CSW verified hotel stay with [name].  CSW has obtained pts signature for "[facility] Waiver".  [name],RN has been made aware of pts dc.  Transportation has been called.  CSW will continue to follow for dc needs.  [name], Fred Thomas, Fred Thomas Pronouns:  She, Her, Hers                  [facility] ED Transitions of CareClinical Social Worker [email or web address] [phone]

## 2020-03-05 NOTE — Progress Notes (Signed)
TOC CM contacted [employer] for Rolling Walker with seat to be delivered to patient's room. Waiting confirmation for acceptance at [facility]. Isidoro Donning RN CCM, [facility] ED TOC CM [phone]

## 2020-03-05 NOTE — TOC Progression Note (Signed)
CSW updated that pt is ready for d/c and in need of transportation. CSW was also informed that pt uses rolling walker and will need assistance inside hotel, getting key, and to his room. Due to this pt cannot be set up with transportation this evening as [employer] will not be able to provide pt with assistance and [facility] cannot take pt with the walker. Pt will need to be set up with [facility] in the morning.

## 2020-03-05 NOTE — ED Notes (Addendum)
Patient unable to transport tonight.  Patient will need assistance with getting a key at the front desk of hotel.  He will also need assistance with ambulating to his room since he uses a walker.  [employer] do not provide this type of service for riders.  Although [facility] does provide this level of service, they do not transport patients home after 6pm.

## 2020-03-05 NOTE — ED Provider Notes (Signed)
Emergency Medicine Observation Re-evaluation Note  ABDULAHI Thomas is a 85 y.o. male, seen on rounds today.  Pt initially presented to the ED for complaints of Fall Currently, the patient is awaiting psych placement.  Physical Exam  BP (!) 144/85 (BP Location: Right Arm)   Pulse 88   Temp 98.6 F (37 C) (Oral)   Resp 18   Ht 5\' 9"  (1.753 m)   Wt 83.9 kg   SpO2 96%   BMI 27.32 kg/m  Physical Exam General: calm and cooperative Cardiac: well perfused.  Lungs: even, unlabored respirations Psych: calm.   ED Course / MDM  EKG:EKG Interpretation  Date/Time:  Monday February 24 2020 12:14:56 EST Ventricular Rate:  101 PR Interval:    QRS Duration: 90 QT Interval:  361 QTC Calculation: 468 R Axis:   96 Text Interpretation: Sinus tachycardia Right axis deviation Confirmed by [email or web address] [phone]) on 02/24/2020 12:39:43 PM  Clinical Course as of 03/05/20 0810  Mon Feb 24, 2020  0155 Patient continues to refuse x-ray and any work-up pain medicine I have offered him.  He now states that he feels like he is going to commit suicide if we do not let him nap.  He states he feels "so bad" that he just might need to commit suicide.  I discussed with him that I am trying to help him and that he is not allowing me to help him.  He has no plan of suicidality.  He states "just send me up to Feb 26, 2020 for suicide."  He reports that he is unhappy being at the days and and that he is not comfortable there. [CH]  0540 Spoke to psychiatry regarding TTS evaluation to restratify.  I feel the patient is likely malingering.  He was seen approximately 6 months ago at behavioral health with similar presentation of suicidality without a plan.  Reportedly poor living conditions in a hotel.  At that time he was accompanied by his son.  Per TTS, reevaluation in the morning. [CH]    Clinical Course User Index [CH] Horton, Redge Gainer, MD   I have reviewed the labs performed to date as well as medications  administered while in observation.  Recent changes in the last 24 hours include awaiting placement for inpatient psych.  Plan  Current plan is for inpatient psych placement.   Mayer Masker, MD 03/06/20 [phone]

## 2020-03-05 NOTE — Progress Notes (Addendum)
TOC CM/CSW spoke with pts son/Fred Thomas [phone].  CSW informed Fred Thomas that pt was not accepted into [facility], therefore pt was returning to hotel.  Fred Thomas stated he has concerns with pt returning to hotel.    CSW will continue to follow for dc needs.  Fred Thomas, MSW, LCSW-A Pronouns:  She, Her, Hers                  [name] Transitions of CareClinical Social Worker [email or web address] [phone]

## 2020-03-05 NOTE — Discharge Instructions (Signed)
Please follow-up with your primary care physician for further monitoring management.  If any symptoms change or worsen, please return to the nearest emergency room.

## 2020-03-05 NOTE — Progress Notes (Signed)
Physical Therapy Treatment Patient Details Name: Fred Thomas MRN: [id number] DOB: [birth year] Today's Date: 03/05/2020    History of Present Illness Pt is 85 yo male with history of hypertension, hyperlipidemia who presents following a fall.  Patient reports that he fell 5 days ago 1/18 after he slipped on some ice.  All imaging negative.  Pt later with reports of suicidial ideations, but has now been cleared by psych.    PT Comments    Pt in bed easily aroused.  Pleasant.  Assisted OOB to amb a functional distance.  General bed mobility comments: pt requested HOB elevated and required assist with upper body.  General transfer comment: Min A to steady with cues for safe hand placement  General Gait Details: min A to steady with multiple cues for safe proximity  No overt LOB.   Tolerated session well.    Follow Up Recommendations  SNF     Equipment Recommendations  Rolling walker with 5" wheels;Other (comment) (2XB Rollator)    Recommendations for Other Services       Precautions / Restrictions Precautions Precautions: Fall    Mobility  Bed Mobility Overal bed mobility: Needs Assistance Bed Mobility: Supine to Sit;Sit to Supine     Supine to sit: Min guard;Supervision Sit to supine: Min guard;Min assist   General bed mobility comments: pt requested HOB elevated and required assist with upper body  Transfers Overall transfer level: Needs assistance Equipment used: Rolling walker (2 wheeled) Transfers: Sit to/from [employer] Sit to Stand: Min assist;Min guard Stand pivot transfers: Min assist       General transfer comment: Min A to steady with cues for safe hand placement  Ambulation/Gait Ambulation/Gait assistance: Min assist Gait Distance (Feet): 75 Feet Assistive device: Rolling walker (2 wheeled) Gait Pattern/deviations: Step-through pattern;Decreased stride length Gait velocity: decreased   General Gait Details: min A to steady with  multiple cues for safe RW proximity   Stairs             Wheelchair Mobility    Modified Rankin (Stroke Patients Only)       Balance                                            Cognition Arousal/Alertness: Awake/alert Behavior During Therapy: WFL for tasks assessed/performed Overall Cognitive Status: Within Functional Limits for tasks assessed                                 General Comments: AxO x 3 pleasant, talkative, cooperative      Exercises      General Comments        Pertinent Vitals/Pain Pain Assessment: Faces Faces Pain Scale: Hurts a little bit Pain Location: back Pain Descriptors / Indicators: Discomfort Pain Intervention(s): Monitored during session    Home Living                      Prior Function            PT Goals (current goals can now be found in the care plan section) Progress towards PT goals: Progressing toward goals    Frequency    Min 2X/week      PT Plan Current plan remains appropriate    Co-evaluation  AM-PAC PT "6 Clicks" Mobility   Outcome Measure  Help needed turning from your back to your side while in a flat bed without using bedrails?: A Little Help needed moving from lying on your back to sitting on the side of a flat bed without using bedrails?: A Little Help needed moving to and from a bed to a chair (including a wheelchair)?: A Little Help needed standing up from a chair using your arms (e.g., wheelchair or bedside chair)?: A Little Help needed to walk in hospital room?: A Little Help needed climbing 3-5 steps with a railing? : A Little 6 Click Score: 18    End of Session Equipment Utilized During Treatment: Gait belt Activity Tolerance: Patient tolerated treatment well Patient left: in bed;with call bell/phone within reach;with bed alarm set Nurse Communication: Mobility status PT Visit Diagnosis: Unsteadiness on feet (R26.81);History of  falling (Z91.81)     Time: 0086-7619 PT Time Calculation (min) (ACUTE ONLY): 12 min  Charges:  $Gait Training: 8-22 mins                     Felecia Shelling  PTA Acute  Rehabilitation Services Pager      [phone] Office      [phone]

## 2020-03-05 NOTE — Progress Notes (Signed)
TOC CM/CSW received a call from [facility] [phone].  Cierra stated she would be by today between the hours of 10am and 12am to assess pt for prospective acceptance into [facility].  CSW will continue to follow for dc needs.  Aylanie Cubillos Tarpley-Carter, MSW, LCSW-A Pronouns:  She, Her, Hers                  [name] Transitions of CareClinical Social Worker [email or web address] [phone]

## 2020-03-05 NOTE — Progress Notes (Signed)
TOC CM/CSW attempted to contact [facility]/Cierra Singleterry [phone], Fax [phone] to follow up on review.  CSW left HIPPA compliant message with my contact information.  CSW will continue to follow for dc needs.  Margarette Vannatter Tarpley-Carter, MSW, LCSW-A Pronouns:  She, Her, Hers                  [name] Transitions of CareClinical Social Worker [email or web address] [phone]

## 2020-03-05 NOTE — ED Notes (Signed)
Called patient's son Cullipher) to get address for hotel.  Address is [address], [place], [place] [address] [address].  Son is not in agreement with patient discharging at this time of night and considers it "inhumane."  He also asked that I document his concerns about patient discharging at this time of night.

## 2020-03-06 NOTE — Progress Notes (Signed)
TOC CM/CSW reached out to [employer].  [facility] has been scheduled for 8:30am.  Pt will have assistance to the hotel desk and assistance to his room.  CSW will update RN.  CSW will continue to follow for dc needs.  [name], MSW, LCSW-A Pronouns:  She, Her, Hers                  [name] Transitions of CareClinical Social Worker [email or web address] [phone]

## 2020-03-06 NOTE — ED Notes (Signed)
Pt DC off unit to home per provider. Pt alert, cooperative, no s/s of distress. DC information given to pt. Belongings given to pt. Pt off unit in w/c, escorted by NT. Pt transported by [employer].

## 2020-03-06 NOTE — Progress Notes (Signed)
03/06/2020 @ 9:36am  TOC CM/CSW spoke with [facility] Supervisor/Ben.  Romeo Apple stated pt arrived at hotel safely.  Pt obtained key and made it to room safely per driver.  CSW will continue to follow for dc needs.  Melani Brisbane Tarpley-Carter, MSW, LCSW-A Pronouns:  She, Her, Hers                  [name] Transitions of CareClinical Social Worker [email or web address] [phone]

## 2021-11-05 IMAGING — CT CT T SPINE W/O CM
3 series · 9 of 33 positions shown, 10 images · non-contrast
Comparison: None.

CLINICAL DATA: Fall

EXAM:
CT THORACIC AND LUMBAR SPINE WITHOUT CONTRAST
TECHNIQUE: Multidetector CT imaging of the thoracic and lumbar spine was
performed without contrast. Multiplanar CT image reconstructions
were also generated.

[Series 4: t spine st · axial · 0.35mm/px · z∈[+1512,+1512]mm · 1 of 174 slices shown, 2 images]
[im 94/174  soft-tissue]
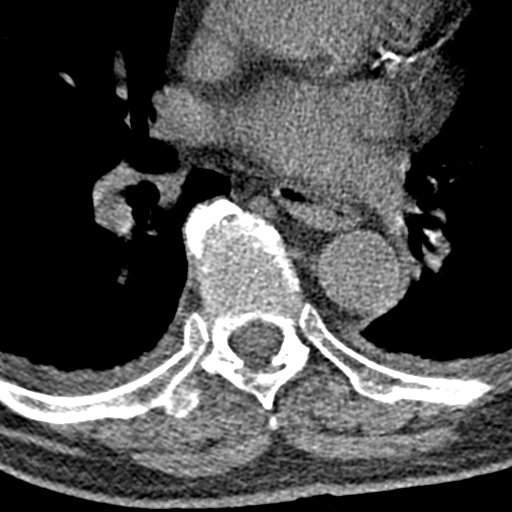
[im 94/174  bone]
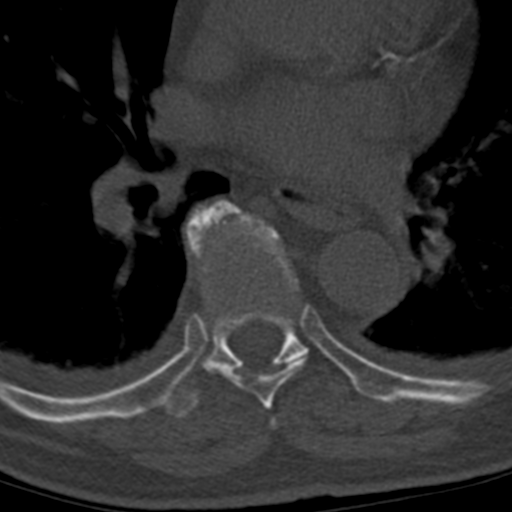

[Series 8: coronal bone · coronal · 0.23mm/px · 3 of 91 slices shown]
[im 19/91  bone]
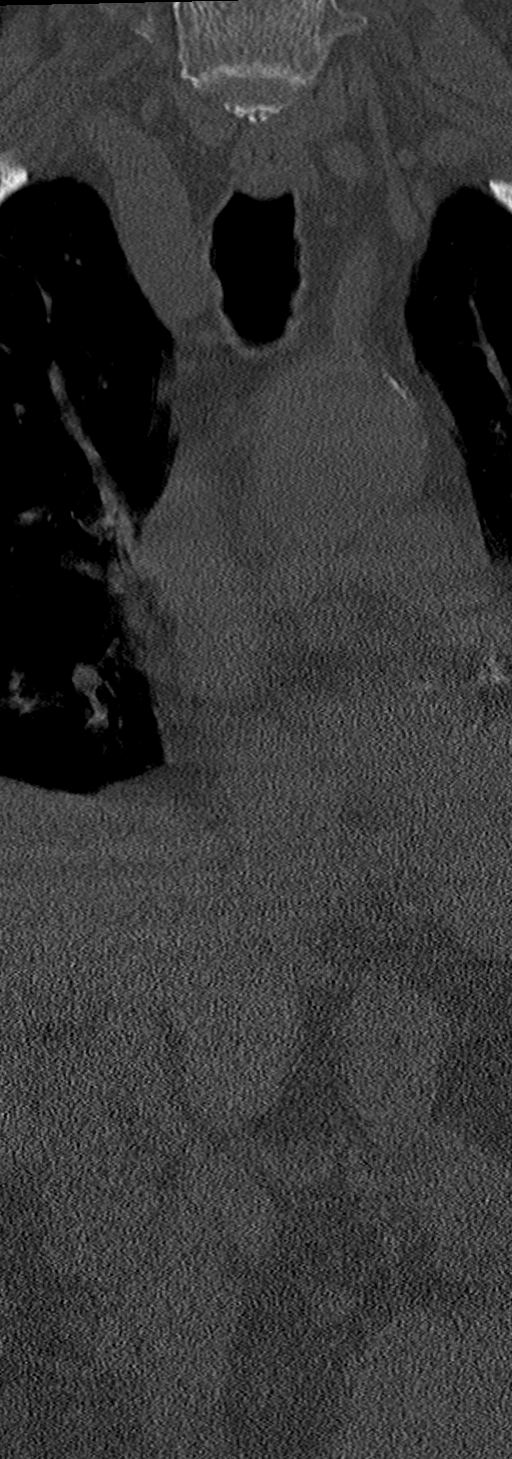
[im 37/91  bone]
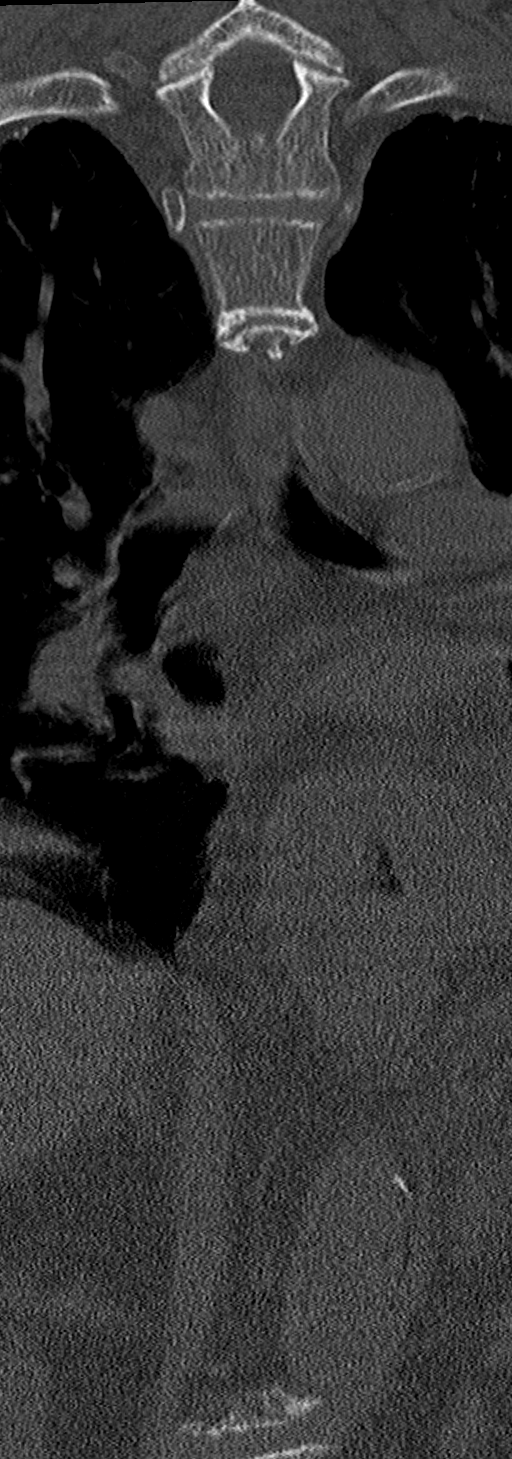
[im 55/91  bone]
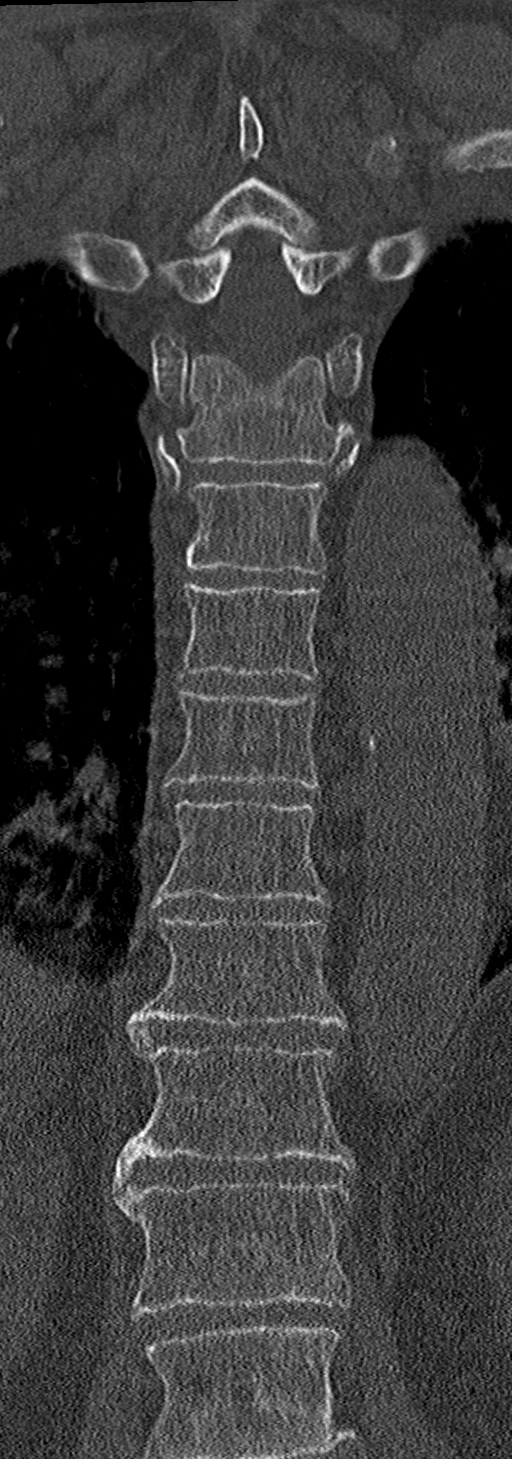

[Series 9: sagittal bone · sagittal · 0.35mm/px · 5 of 61 slices shown]
[im 21/61  bone]
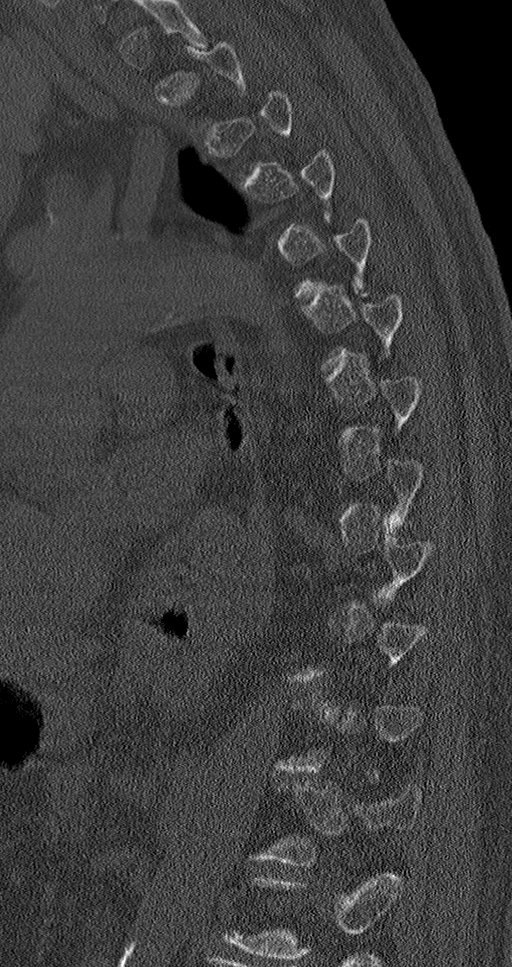
[im 26/61  bone]
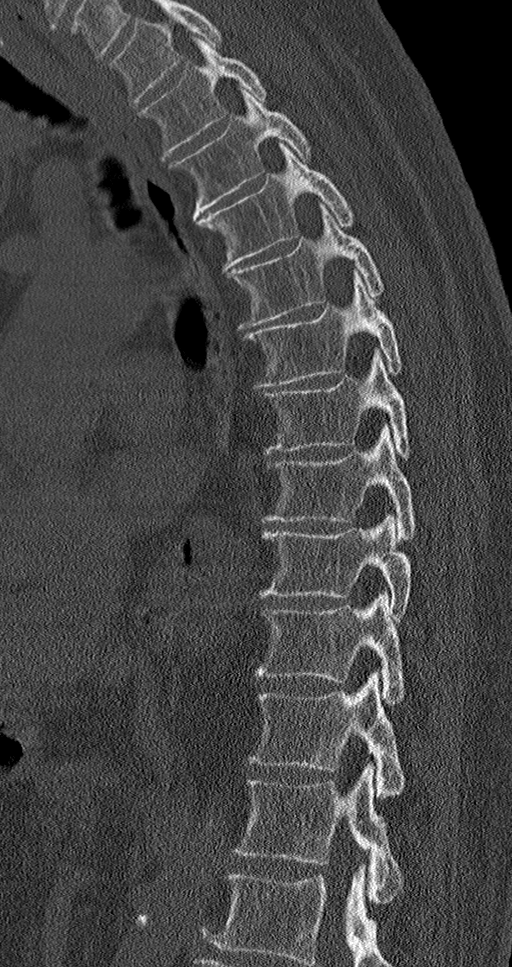
[im 31/61  bone]
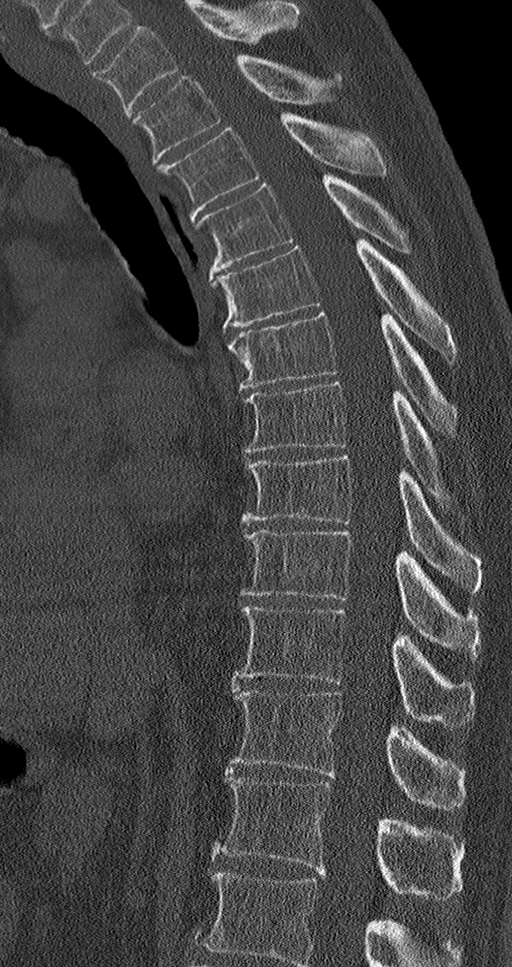
[im 36/61  bone]
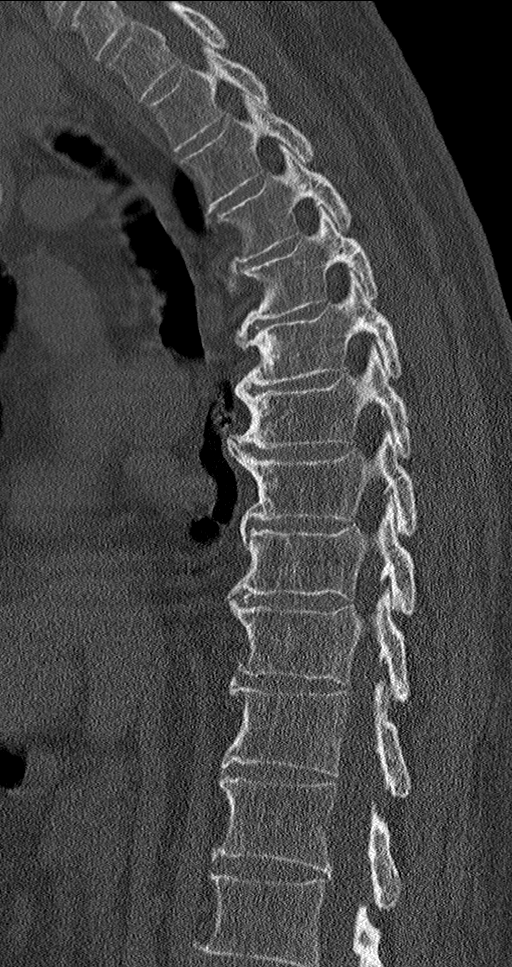
[im 41/61  bone]
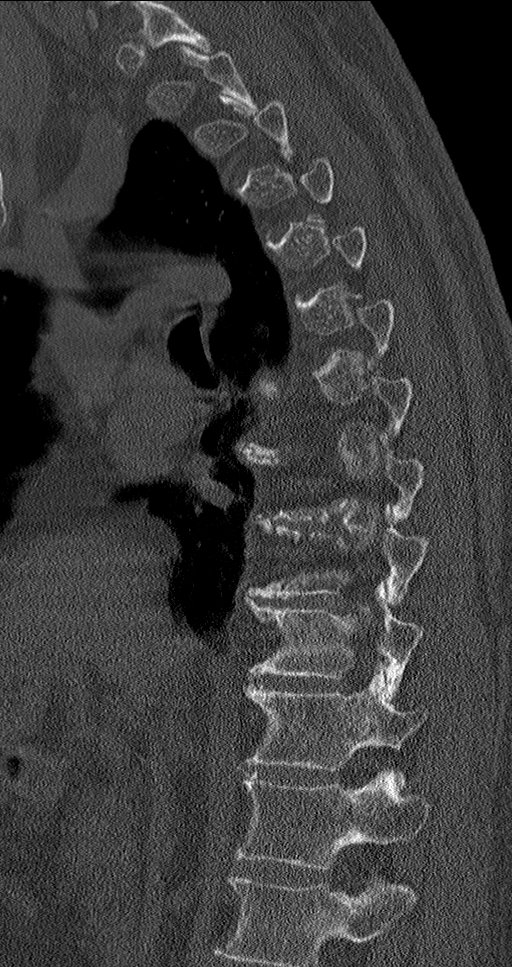

[9 of 33 positions shown; findings below may reference images not displayed]

FINDINGS: CT THORACIC SPINE FINDINGS

Alignment: Normal

Vertebrae: No acute fracture

Paraspinal and other soft tissues: Bibasilar atelectasis.

Disc levels: At the T1-2 level, at the left dorsal position, there
is a small hyperdense object measuring 9 x 4 mm. The other disc
levels are unremarkable.

CT LUMBAR SPINE FINDINGS

Segmentation: Standard

Alignment: Normal

Vertebrae: No acute fracture.

Paraspinal and other soft tissues: Calcific aortic atherosclerosis.

Disc levels:

L1-2: Small disc bulge without stenosis.

L2-3: Small disc bulge mild spinal canal stenosis. Moderate facet
hypertrophy.

L3-4: Moderate facet hypertrophy and small disc bulge. Mild
bilateral foraminal stenosis. No spinal canal stenosis.

L4-5: Moderate facet hypertrophy with small disc bulge. No spinal
canal stenosis. Severe right foraminal stenosis.

L5-S1: Severe facet hypertrophy without spinal canal or neural
foraminal stenosis.
IMPRESSION: 1. Small hyperdense object at the left dorsal position at the T1-2
level, measuring 9 x 4 mm. This is likely a small meningioma. This
can be confirmed with MRI of the thoracic spine without and with
contrast.
2. No acute fracture or static subluxation of the thoracic or lumbar
spine.
3. Severe right L4-5 foraminal stenosis.

Aortic Atherosclerosis ([id number].S).
